# Patient Record
Sex: Female | Born: 1935 | Race: White | Hispanic: No | Smoking: Never smoker
Health system: Southern US, Community
[De-identification: ages and names within clinical notes are randomized; demographics above are authoritative.]

## PROBLEM LIST (undated history)

## (undated) DIAGNOSIS — D126 Benign neoplasm of colon, unspecified: Secondary | ICD-10-CM

## (undated) DIAGNOSIS — F419 Anxiety disorder, unspecified: Secondary | ICD-10-CM

## (undated) HISTORY — DX: Benign neoplasm of colon, unspecified: D12.6

## (undated) HISTORY — PX: BREAST BIOPSY: SHX20

## (undated) HISTORY — DX: Anxiety disorder, unspecified: F41.9

---

## 1957-11-24 HISTORY — PX: TONSILLECTOMY: SUR1361

## 1957-11-24 HISTORY — PX: APPENDECTOMY: SHX54

## 1999-06-05 ENCOUNTER — Encounter (INDEPENDENT_AMBULATORY_CARE_PROVIDER_SITE_OTHER): Payer: Self-pay | Admitting: Specialist

## 1999-06-05 ENCOUNTER — Other Ambulatory Visit: Admission: RE | Admit: 1999-06-05 | Discharge: 1999-06-05 | Payer: Self-pay | Admitting: Gastroenterology

## 1999-11-20 ENCOUNTER — Encounter: Admission: RE | Admit: 1999-11-20 | Discharge: 1999-11-20 | Payer: Self-pay | Admitting: Obstetrics and Gynecology

## 1999-11-20 ENCOUNTER — Encounter: Payer: Self-pay | Admitting: Obstetrics and Gynecology

## 1999-12-11 ENCOUNTER — Encounter: Admission: RE | Admit: 1999-12-11 | Discharge: 1999-12-11 | Payer: Self-pay | Admitting: Obstetrics and Gynecology

## 1999-12-11 ENCOUNTER — Encounter: Payer: Self-pay | Admitting: Obstetrics and Gynecology

## 2000-01-18 ENCOUNTER — Ambulatory Visit (HOSPITAL_COMMUNITY): Admission: RE | Admit: 2000-01-18 | Discharge: 2000-01-18 | Payer: Self-pay | Admitting: Internal Medicine

## 2000-01-18 ENCOUNTER — Encounter: Payer: Self-pay | Admitting: Internal Medicine

## 2001-07-07 ENCOUNTER — Encounter: Payer: Self-pay | Admitting: Obstetrics and Gynecology

## 2001-07-07 ENCOUNTER — Encounter: Admission: RE | Admit: 2001-07-07 | Discharge: 2001-07-07 | Payer: Self-pay | Admitting: Obstetrics and Gynecology

## 2002-09-21 ENCOUNTER — Encounter: Admission: RE | Admit: 2002-09-21 | Discharge: 2002-09-21 | Payer: Self-pay | Admitting: Obstetrics and Gynecology

## 2002-09-21 ENCOUNTER — Encounter: Payer: Self-pay | Admitting: Obstetrics and Gynecology

## 2004-06-13 ENCOUNTER — Observation Stay (HOSPITAL_COMMUNITY): Admission: EM | Admit: 2004-06-13 | Discharge: 2004-06-14 | Payer: Self-pay | Admitting: Emergency Medicine

## 2004-06-14 ENCOUNTER — Observation Stay (HOSPITAL_COMMUNITY): Admission: RE | Admit: 2004-06-14 | Discharge: 2004-06-15 | Payer: Self-pay | Admitting: Cardiology

## 2004-06-19 ENCOUNTER — Encounter: Admission: RE | Admit: 2004-06-19 | Discharge: 2004-06-19 | Payer: Self-pay | Admitting: Obstetrics and Gynecology

## 2004-07-10 ENCOUNTER — Encounter: Admission: RE | Admit: 2004-07-10 | Discharge: 2004-07-10 | Payer: Self-pay | Admitting: Internal Medicine

## 2004-11-21 ENCOUNTER — Ambulatory Visit: Payer: Self-pay | Admitting: Internal Medicine

## 2005-03-24 ENCOUNTER — Ambulatory Visit: Payer: Self-pay | Admitting: Internal Medicine

## 2005-04-14 ENCOUNTER — Ambulatory Visit: Payer: Self-pay | Admitting: Internal Medicine

## 2005-05-07 ENCOUNTER — Ambulatory Visit: Payer: Self-pay | Admitting: Internal Medicine

## 2005-05-21 ENCOUNTER — Ambulatory Visit: Payer: Self-pay | Admitting: Family Medicine

## 2006-09-11 ENCOUNTER — Ambulatory Visit: Payer: Self-pay | Admitting: Internal Medicine

## 2006-09-11 ENCOUNTER — Encounter: Admission: RE | Admit: 2006-09-11 | Discharge: 2006-09-11 | Payer: Self-pay | Admitting: Internal Medicine

## 2007-03-29 ENCOUNTER — Encounter: Admission: RE | Admit: 2007-03-29 | Discharge: 2007-03-29 | Payer: Self-pay | Admitting: Obstetrics and Gynecology

## 2007-06-17 ENCOUNTER — Ambulatory Visit: Payer: Self-pay | Admitting: Internal Medicine

## 2007-06-17 DIAGNOSIS — R7989 Other specified abnormal findings of blood chemistry: Secondary | ICD-10-CM | POA: Insufficient documentation

## 2007-06-17 DIAGNOSIS — I059 Rheumatic mitral valve disease, unspecified: Secondary | ICD-10-CM | POA: Insufficient documentation

## 2007-10-06 ENCOUNTER — Ambulatory Visit: Payer: Self-pay | Admitting: Internal Medicine

## 2008-05-04 ENCOUNTER — Encounter: Admission: RE | Admit: 2008-05-04 | Discharge: 2008-05-04 | Payer: Self-pay | Admitting: Obstetrics and Gynecology

## 2008-08-16 ENCOUNTER — Ambulatory Visit: Payer: Self-pay | Admitting: Internal Medicine

## 2008-09-01 ENCOUNTER — Telehealth (INDEPENDENT_AMBULATORY_CARE_PROVIDER_SITE_OTHER): Payer: Self-pay | Admitting: *Deleted

## 2008-09-04 ENCOUNTER — Ambulatory Visit: Payer: Self-pay | Admitting: Internal Medicine

## 2008-09-27 ENCOUNTER — Ambulatory Visit: Payer: Self-pay | Admitting: Gastroenterology

## 2008-10-07 ENCOUNTER — Ambulatory Visit: Payer: Self-pay | Admitting: Internal Medicine

## 2008-10-07 ENCOUNTER — Encounter: Payer: Self-pay | Admitting: Internal Medicine

## 2008-10-07 ENCOUNTER — Inpatient Hospital Stay (HOSPITAL_COMMUNITY): Admission: EM | Admit: 2008-10-07 | Discharge: 2008-10-09 | Payer: Self-pay | Admitting: *Deleted

## 2008-10-07 DIAGNOSIS — K921 Melena: Secondary | ICD-10-CM | POA: Insufficient documentation

## 2008-10-09 ENCOUNTER — Encounter: Payer: Self-pay | Admitting: Gastroenterology

## 2008-10-10 ENCOUNTER — Telehealth (INDEPENDENT_AMBULATORY_CARE_PROVIDER_SITE_OTHER): Payer: Self-pay | Admitting: *Deleted

## 2008-10-17 ENCOUNTER — Ambulatory Visit: Payer: Self-pay | Admitting: Internal Medicine

## 2008-10-17 DIAGNOSIS — E876 Hypokalemia: Secondary | ICD-10-CM | POA: Insufficient documentation

## 2008-10-24 ENCOUNTER — Encounter (INDEPENDENT_AMBULATORY_CARE_PROVIDER_SITE_OTHER): Payer: Self-pay | Admitting: *Deleted

## 2008-10-24 LAB — CONVERTED CEMR LAB
BUN: 15 mg/dL (ref 6–23)
Basophils Absolute: 0 10*3/uL (ref 0.0–0.1)
Eosinophils Relative: 2.1 % (ref 0.0–5.0)
Hemoglobin: 14.3 g/dL (ref 12.0–15.0)
Lymphocytes Relative: 39.9 % (ref 12.0–46.0)
MCHC: 34.8 g/dL (ref 30.0–36.0)
MCV: 99.7 fL (ref 78.0–100.0)
Monocytes Absolute: 0.4 10*3/uL (ref 0.1–1.0)
Neutrophils Relative %: 47.5 % (ref 43.0–77.0)
RBC: 4.12 M/uL (ref 3.87–5.11)
RDW: 12.3 % (ref 11.5–14.6)
WBC: 4 10*3/uL — ABNORMAL LOW (ref 4.5–10.5)

## 2009-04-25 ENCOUNTER — Encounter (INDEPENDENT_AMBULATORY_CARE_PROVIDER_SITE_OTHER): Payer: Self-pay | Admitting: *Deleted

## 2009-06-08 ENCOUNTER — Encounter: Admission: RE | Admit: 2009-06-08 | Discharge: 2009-06-08 | Payer: Self-pay | Admitting: Internal Medicine

## 2009-08-21 ENCOUNTER — Ambulatory Visit: Payer: Self-pay | Admitting: Internal Medicine

## 2010-01-21 ENCOUNTER — Ambulatory Visit: Payer: Self-pay | Admitting: Internal Medicine

## 2010-01-21 ENCOUNTER — Telehealth (INDEPENDENT_AMBULATORY_CARE_PROVIDER_SITE_OTHER): Payer: Self-pay | Admitting: *Deleted

## 2010-01-21 LAB — CONVERTED CEMR LAB: Inflenza A Ag: NEGATIVE

## 2010-01-22 ENCOUNTER — Telehealth: Payer: Self-pay | Admitting: Gastroenterology

## 2010-09-05 ENCOUNTER — Ambulatory Visit: Payer: Self-pay | Admitting: Internal Medicine

## 2010-12-22 LAB — CONVERTED CEMR LAB
ALT: 24 units/L (ref 0–35)
Albumin: 4.1 g/dL (ref 3.5–5.2)
Alkaline Phosphatase: 52 units/L (ref 39–117)
BUN: 14 mg/dL (ref 6–23)
Basophils Relative: 0.4 % (ref 0.0–1.0)
Bilirubin, Direct: 0.1 mg/dL (ref 0.0–0.3)
Calcium: 9.1 mg/dL (ref 8.4–10.5)
Chloride: 105 meq/L (ref 96–112)
Cholesterol: 191 mg/dL (ref 0–200)
GFR calc non Af Amer: 76 mL/min
Glucose, Bld: 92 mg/dL (ref 70–99)
HDL: 70.8 mg/dL (ref >39.0)
Hemoglobin: 14.3 g/dL (ref 12.0–15.0)
Hgb A1c MFr Bld: 5.4 % (ref 4.6–6.0)
MCHC: 35.2 g/dL (ref 30.0–36.0)
Neutro Abs: 2.9 10*3/uL (ref 1.4–7.7)
Neutrophils Relative %: 59.7 % (ref 43.0–77.0)
Platelets: 261 10*3/uL (ref 150–400)
Potassium: 3.7 meq/L (ref 3.5–5.1)
RBC: 4.19 M/uL (ref 3.87–5.11)
Total Bilirubin: 0.8 mg/dL (ref 0.3–1.2)
Total CHOL/HDL Ratio: 2.7
Triglycerides: 43 mg/dL (ref 0–149)
VLDL: 9 mg/dL (ref 0–40)

## 2010-12-24 NOTE — Progress Notes (Signed)
Summary: Schedule colonoscopy  Phone Note Outgoing Call Call back at Home Phone [phone]   Call placed by: Harlow Mares CMA Duncan Dull),  January 22, 2010 4:35 PM Call placed to: Patient Summary of Call: spoke to the patient she has a cold now she will call back to schedule her colonoscopy, i gave the patient the phone number to call back  Initial call taken by: Harlow Mares CMA Duncan Dull),  January 22, 2010 4:37 PM

## 2010-12-24 NOTE — Assessment & Plan Note (Signed)
Summary: FLU SHOT///SPH  Nurse Visit   Allergies: 1)  ! * Meclizzine 2)  Pcn 3)  Zithromax 4)  Steroids  Orders Added: 1)  Flu Vaccine 40yrs + MEDICARE PATIENTS [Q2039] 2)  Administration Flu vaccine - MCR [G0008]            Flu Vaccine Consent Questions     Do you have a history of severe allergic reactions to this vaccine? no    Any prior history of allergic reactions to egg and/or gelatin? no    Do you have a sensitivity to the preservative Thimersol? no    Do you have a past history of Guillan-Barre Syndrome? no    Do you currently have an acute febrile illness? no    Have you ever had a severe reaction to latex? no    Vaccine information given and explained to patient? yes    Are you currently pregnant? no    Lot Number:[id number]   Exp Date:05/24/2011   Site Given  Left Deltoid IMu

## 2010-12-24 NOTE — Assessment & Plan Note (Signed)
Summary: COUGH AND CONGESTION-APPT1:15/CDJ   Vital Signs:  Patient profile:   75 year old female Weight:      159 pounds O2 Sat:      97 % on Room air Temp:     99.2 degrees F oral Pulse rate:   110 / minute Resp:     14 per minute BP sitting:   118 / 80  (left arm)  Vitals Entered By: Doristine Devoid (January 21, 2010 1:24 PM)  O2 Flow:  Room air CC: cough and congestion xsat. some fever and bodyaches    Primary Care Provider:  Marga Melnick MD  CC:  cough and congestion xsat. some fever and bodyaches .  History of Present Illness: Onset as mild ST & NP cough 01/19/2010  followed by bandlike headache & malaise. ZO:XWRU Seltzer Cold & Cough. Flu shot taken in 09/2009.  Allergies: 1)  ! * Meclizzine 2)  Pcn 3)  Zithromax 4)  Steroids  Review of Systems General:  Complains of chills; denies fever and sweats. ENT:  Denies nasal congestion and sinus pressure; No facial pain or purulence. Resp:  Denies shortness of breath, sputum productive, and wheezing. Allergy:  Denies itching eyes and sneezing.  Physical Exam  General:  in no acute distress; alert,appropriate and cooperative throughout examination Ears:  External ear exam shows no significant lesions or deformities.  Otoscopic examination reveals clear canals, tympanic membranes are intact bilaterally without bulging, retraction, inflammation or discharge. Hearing is grossly normal bilaterally. Nose:  External nasal examination shows no deformity or inflammation. Nasal mucosa are pink and moist without lesions or exudates. Mouth:  Oral mucosa and oropharynx without lesions or exudates.  Teeth in good repair. Lungs:  Normal respiratory effort, chest expands symmetrically. Lungs are clear to auscultation, no crackles or wheezes. Heart:  Normal rate and regular rhythm. S1 and S2 normal without gallop, murmur, click, rub. S4 Cervical Nodes:  No lymphadenopathy noted Axillary Nodes:  No palpable  lymphadenopathy   Impression & Recommendations:  Problem # 1:  BRONCHITIS-ACUTE (ICD-466.0)  Her updated medication list for this problem includes:    Doxycycline Hyclate 100 Mg Caps (Doxycycline hyclate) .Marland Kitchen... 1 two times a day x 2 days then 1 once daily    Hydrocod Polst-chlorphen Polst 10-8 Mg/60ml Lqcr (Chlorpheniramine-hydrocodone) .Marland Kitchen... 1 tsp q 8-12 hrs as needed for cough  Orders: Prescription Created Electronically [phone]) Flu A+B ([id number])  Problem # 2:  PHARYNGITIS-ACUTE (ICD-462)  essentially resolved  Her updated medication list for this problem includes:    Doxycycline Hyclate 100 Mg Caps (Doxycycline hyclate) .Marland Kitchen... 1 two times a day x 2 days then 1 once daily  Complete Medication List: 1)  Xanax 1 Mg Tabs (Alprazolam) .... 1/4 tab qhs 2)  Doxycycline Hyclate 100 Mg Caps (Doxycycline hyclate) .Marland Kitchen.. 1 two times a day x 2 days then 1 once daily 3)  Hydrocod Polst-chlorphen Polst 10-8 Mg/30ml Lqcr (Chlorpheniramine-hydrocodone) .Marland Kitchen.. 1 tsp q 8-12 hrs as needed for cough  Patient Instructions: 1)  Drink as much fluid as you can tolerate for the next few days. Zicam as needed for sore throat. Prescriptions: HYDROCOD POLST-CHLORPHEN POLST 10-8 MG/5ML LQCR (CHLORPHENIRAMINE-HYDROCODONE) 1 tsp q 8-12 hrs as needed for cough  #60cc x 0   Entered and Authorized by:   Marga Melnick MD   Signed by:   Marga Melnick MD on 01/21/2010   Method used:   Printed then faxed to ...       [facility] [address] (retail)  [address]       [place], [place]  [address]       Ph: [phone]       Fax: [phone]   RxID:   [id number] DOXYCYCLINE HYCLATE 100 MG CAPS (DOXYCYCLINE HYCLATE) 1 two times a day X 2 days then 1 once daily  #12 x 0   Entered and Authorized by:   Marga Melnick MD   Signed by:   Marga Melnick MD on 01/21/2010   Method used:   Faxed to ...       [facility] [address] (retail)       [phone]        [place], [place]  [address]       Ph: [phone]       Fax: [phone]   RxID:   [id number]   Laboratory Results    Other Tests  Influenza A: negative Influenza B: negative  Kit Test Internal QC: Positive   (Normal Range: Negative)

## 2010-12-24 NOTE — Progress Notes (Signed)
Summary: cough  Phone Note Call from Patient Call back at Home Phone [phone]   Caller: Patient Summary of Call: patient husband stated that patient has a bad cough. Patient cough started on friday. Offered patient appt with another Dr. refused. Stated that dr hopper wanted to see her. Please advise Initial call taken by: Barb Merino,  January 21, 2010 9:06 AM  Follow-up for Phone Call        spoke w/ patient informed we can work in today w/ Dr. Alwyn Ren.Marland KitchenMarland KitchenMarland KitchenDoristine Devoid  January 21, 2010 9:37 AM

## 2011-04-08 NOTE — Discharge Summary (Signed)
Kelli Nielsen, Kelli Nielsen                  ACCOUNT NO.:  [id number]   MEDICAL RECORD NO.:  [id number]          PATIENT TYPE:  INP   LOCATION:  1401                         FACILITY:  [facility]   PHYSICIAN:  Rosalyn Gess. Norins, MD  DATE OF BIRTH:  06-04-1936   DATE OF ADMISSION:  10/07/2008  DATE OF DISCHARGE:  10/09/2008                               DISCHARGE SUMMARY   ADMITTING DIAGNOSIS:  Hematochezia.   DISCHARGE DIAGNOSIS:  Probable ischemic colitis, resolving.   CONSULTANTS:  Dr. Claudette Head for GI.   PROCEDURES:  CT scan of the abdomen which showed thickening of the  transverse colon consistent with either infectious colitis versus  ischemic colitis.   HISTORY OF PRESENT ILLNESS:  The patient was in her usual state of  health.  She had a recent exam by Dr. Kyra Manges on October 03, 2008,  that was normal although she reported the rectal exam was painful.  Over  the past 24 hours prior to admission, she developed mild right lower  quadrant abdominal pain and has had loose stools with frank blood.  She  denied fever, sweats, chills, nausea, vomiting, or mucus in the stool.  Last colonoscopy in 2007 by Dr. Arlyce Dice was unremarkable.  Patient was  admitted for IV fluids, monitoring, and IV antibiotics.   Please see the EMR H and P for past medical history, family history,  social history, and physical exam.   HOSPITAL COURSE:  Patient was admitted to a regular bed.  She was  started on ciprofloxacin and Flagyl IV.  She was seen in consultation by  the GI service.  It was felt that this was most likely an inflammatory  process versus an ischemic process.  It was felt she did not need to  have intervention or colonoscopy at this time as long as she was getting  better.   The patient did do well over the next 48 hours.  As of this morning, the  day of discharge, patient reports she has had no abdominal pain or  discomfort.  She has had no blood in her stool.  She has been up and  walking.  She has been tolerating a diet.  With the patient's symptoms  having resolved, she is now at this time felt to be ready for discharge  home.   DISCHARGE EXAM:  Temperature was 98.9.  Blood pressure 146/89.  Heart  rate 84.  Respirations 16.  O2 sat 96% on room air.  GENERAL APPEARANCE:  A well-nourished, well-groomed woman in no  distress.  CARDIOVASCULAR:  Patient had a quiet precordium with regular rate and  rhythm.  ABDOMEN:  Bowel sounds were positive in all 4 quadrants.  Her abdomen  was soft.  No guarding or rebound or tenderness was appreciated.   FINAL LABORATORY:  Final CBC, October 08, 2008, hemoglobin of 11.9 g,  white count was 9800, MCV was 100.2.  Chemistries from October 08, 2008, with a sodium of 139, potassium of 3.0, chloride of 107, CO2 of  26, BUN of 5, creatinine 0.6, glucose of 106.  Patient  did have liver  functions at the time of admission which were normal.  Urinalysis at  admission was negative.   DISPOSITION:  Patient is discharged home.  She will follow up with Dr.  Arlyce Dice for GI followup.  She will be continued on Cipro 500 mg p.o.  b.i.d. for 5 additional days and Flagyl 250 mg q.i.d. for 5 additional  days.  Patient will see Dr. Dolores Patty in followup in 7 to 10  days.   Patient's condition at time of discharge dictation is stable and  improved.      Rosalyn Gess Norins, MD  Electronically Signed     MEN/MEDQ  D:  10/09/2008  T:  10/09/2008  Job:  [id number]   cc:   Barbette Hair. Arlyce Dice, MD,FACG  [address]  [place]  [place] [address]   Titus Dubin. Alwyn Ren, MD,FACP,FCCP  [phone] [place]  [place] [address]

## 2011-04-11 NOTE — Discharge Summary (Signed)
NAMEJAKAI, ONOFRE                              ACCOUNT NO.:  [id number]   MEDICAL RECORD NO.:  [id number]                   PATIENT TYPE:  INP   LOCATION:  0354                                 FACILITY:  [facility]   PHYSICIAN:  Thomas C. Wall, M.D.                DATE OF BIRTH:  05-Oct-1936   DATE OF ADMISSION:  06/13/2004  DATE OF DISCHARGE:  06/14/2004                                 DISCHARGE SUMMARY   DISCHARGE DIAGNOSES:  1. Presyncope.     A. Etiology unclear.  2. Elevated troponin-I levels.     A. Normal coronary arteries by catheterization.  3. Normal left ventricular function.  4. Elevated liver function tests.     A. Abdominal ultrasound revealing normal liver, gallbladder, and bile        ducts.     B. Incidental finding of mild hydronephrosis of the right kidney on        abdominal ultrasound.  5. A 4-mm nonspecific right lower lobe nodule.     A. Followup per primary care physician.  6. History of mitral valve prolapse.  7. History of pancreatitis.   PROCEDURE PERFORMED THIS ADMISSION:  1. Cardiac catheterization by Dr. Samule Ohm on June 14, 2004, revealing normal     coronary arteries and an EF of 60% without regional wall motion     abnormalities.  2. Chest CT negative for pulmonary emboli, minimal basilar atelectasis, and     4-mm nonspecific right lower lobe nodule.   HOSPITAL COURSE:  Please see the admission history and physical and consult  notes for complete details. The patient was initially admitted by the  internal medicine service for presyncope. There was no frank syncope. She  was referred to the emergency room by her primary care physician. Dr. Daleen Squibb  consulted on the patient. She had mildly elevated troponins, but normal CK-  MBs. Dr. Daleen Squibb recommended patient undergo cardiac catheterization.  Of note  she had an echocardiogram that revealed normal left ventricle and no  valvular abnormalities.  The patient went for cardiac catheterization on  June 14, 2004, by Dr. Samule Ohm. As noted above, she had normal coronary  arteries and an EF of 50%. She was also sent for spiral CT of the chest,  rule out pulmonary emboli. As noted above she had no pulmonary emboli, but a  nonspecific right lower lobe nodule.   Upon admission the patient also had noted elevated LFTs with an AST of 82  and an ALT of 59. Follow-up LFTs revealed an AST of 39 and an ALT of 41.  Abdominal ultrasound was ordered by internal medicine and the results are as  noted above.  The patient will need follow-up with Dr. Alwyn Ren for this.   The patient also had orthostats performed during this admission. These were  unrevealing. She had some hypokalemia after catheterization. This was  repleted prior to  her discharge.  The patient was evaluated on June 15, 2004, and felt ready for discharge to home. So far her workup for presyncope  has been negative she will need an outpatient event recorder and follow up  with Dr. Daleen Squibb in about one month's time.  She has been asked to follow up  with Dr. Alwyn Ren in the next couple of weeks.   LABORATORY DATA:  White count 5200, hemoglobin 12.3, hematocrit 35.9,  platelet count 221,000. INR on admission was 0.9. Sodium 135, potassium 3.4,  chloride 110, CO2 21, glucose 95, BUN 9, creatinine 0.7. LFTs as noted  above.  Hemoglobin A1C 5.5.  CK-MB is negative. Troponin-I 0.46, 0.37, 0.22.  TSH 2.410. Urinalysis negative except for trace ketones.  Chest CT as noted  above. A 2-D echocardiogram as noted above.  Chest x-ray on admission  revealed generalized parabronchial thickening, but no definite acute disease  in the chest.  Abdominal ultrasound as noted above.   DISCHARGE MEDICATIONS:  The patient is to resume her prior home medications.  These include HCTZ 25 mg one-half tablet daily p.r.n. and Xanax 0.25 mg half  tablet q.h.s.   PAIN MANAGEMENT:  Tylenol as needed.   ACTIVITY:  As tolerated.   DIET:  Low fat, low sodium.   WOUND CARE:   The patient is to follow up for his groin swelling, bleeding or  bruising.   FOLLOWUP:  1. The patient will need to see Dr. Alwyn Ren in the next one to two weeks.     A. The patient will need to follow up on the mild hydronephrosis noted on        her abdominal ultrasound of the right kidney as well as follow up for        her mildly elevated LFTs and her 4 mm nonspecific right lower lobe        nodule.  2. The patient will be set up with a 30-day event recorder through our     office and be contacted.  3. The patient will see Dr. Daleen Squibb in about one month's time for followup.      Tereso Newcomer, P.A.                        Thomas C. Wall, M.D.    SW/MEDQ  D:  06/15/2004  T:  06/15/2004  Job:  [id number]   cc:   Titus Dubin. Alwyn Ren, M.D. [facility]   Maisie Fus C. Wall, M.D.

## 2011-04-11 NOTE — Cardiovascular Report (Signed)
NAMEDEMECIA, Kelli Nielsen   MEDICAL RECORD NO.:  [id number]                   PATIENT TYPE:  OBV   LOCATION:  6522                                 FACILITY:  [facility]   PHYSICIAN:  Kelli Nielsen, Kelli Nielsen         DATE OF BIRTH:  1936-04-11   DATE OF PROCEDURE:  DATE OF DISCHARGE:  06/15/2004                              CARDIAC CATHETERIZATION   PROCEDURE:  Left-heart catheterized, left ventriculography, coronary  angiography, Angio-Seal closure of the right common femoral arteriotomy  site.   INDICATION:  Kelli Nielsen is a 75 year old lady without prior history of  cardiovascular disease.  She presents with abrupt episode of unheralded  presyncope without chest pain or dyspnea.  Troponins have become mildly  elevated at 0.44.  Based on this evidence of myocardial injury, she was  referred for diagnostic catheterization.   PROCEDURAL TECHNIQUE:  Informed consent was obtained.  Under 1% lidocaine  local anesthesia, a 6-French sheath was placed in the right common femoral  artery using the modified Seldinger technique.  Diagnostic angiography and  ventriculography were performed using JL-4, JR-4 and pigtail catheters.  The  arteriotomy was then closed using a 6-French Angio-Seal device.  Complete  hemostasis was obtained.  The patient tolerated the procedure well, and was  transferred to the holding room in stable condition.   COMPLICATIONS:  None.   FINDINGS:  1. LV:  149/13/14.  EF 60% without regional wall motion abnormality.  2. No aortic stenosis or mitral regurgitation.  3. Left main:  Angiographically normal.  4. LAD:  A moderate-size vessel giving rise to two diagonal branches.  It is     angiographically normal.  5. Circumflex:  A moderate-sized vessel giving rise to one obtuse marginal.     It is angiographically normal.  6. RCA:  A large, dominant vessel.  It is angiographically normal.   IMPRESSION/PLAN:  The patient has  angiographically-normal coronary arteries  and normal left ventricular size and systolic function.  Thus, the etiology  of her near syncope and elevated troponin remains unclear.  We will check CT  angiogram to rule out PE, though I have a low clinical suspicion.                                               Kelli Nielsen, M.D. [facility]    WED/MEDQ  D:  06/14/2004  T:  06/16/2004  Job:  [id number]   cc:   Thomas C. Wall, M.D.   Titus Dubin. Alwyn Ren, M.D. [facility]

## 2011-04-17 ENCOUNTER — Other Ambulatory Visit: Payer: Self-pay | Admitting: Internal Medicine

## 2011-04-17 DIAGNOSIS — Z1231 Encounter for screening mammogram for malignant neoplasm of breast: Secondary | ICD-10-CM

## 2011-04-28 ENCOUNTER — Ambulatory Visit
Admission: RE | Admit: 2011-04-28 | Discharge: 2011-04-28 | Disposition: A | Discharge status: Home / Self Care | Payer: Medicare Other | Source: Ambulatory Visit | Attending: Internal Medicine | Admitting: Internal Medicine

## 2011-04-28 DIAGNOSIS — Z1231 Encounter for screening mammogram for malignant neoplasm of breast: Secondary | ICD-10-CM

## 2011-07-22 ENCOUNTER — Encounter: Payer: Self-pay | Admitting: Gastroenterology

## 2011-07-31 ENCOUNTER — Ambulatory Visit (AMBULATORY_SURGERY_CENTER): Payer: Medicare Other | Admitting: *Deleted

## 2011-07-31 VITALS — Ht 68.0 in | Wt 158.0 lb

## 2011-07-31 DIAGNOSIS — Z1211 Encounter for screening for malignant neoplasm of colon: Secondary | ICD-10-CM

## 2011-07-31 MED ORDER — NA SULFATE-K SULFATE-MG SULF 17.5-3.13-1.6 GM/177ML PO SOLN: 1.0000 | ORAL | Status: DC

## 2011-08-01 ENCOUNTER — Telehealth: Payer: Self-pay | Admitting: Gastroenterology

## 2011-08-01 NOTE — Telephone Encounter (Signed)
Pt had questions about taking her Align, told her to hold it Sunday, asked about taking Xanax Sunday evening, advised that she could. Wanted to know if she could wear her contacts Monday and leave her polish on. Pt does sleep in lenses advised that she could wear them for procedure and leave polish on. Wanted to know what she could do to help relieve excoriation with prep, advised vaseline and baby wipes. Asked if she could use preparation H, told her it burn and it's purpose was more for hemorrhoids but it would not hurt if she desired. TE

## 2011-08-04 ENCOUNTER — Encounter: Payer: Self-pay | Admitting: Gastroenterology

## 2011-08-04 ENCOUNTER — Ambulatory Visit (AMBULATORY_SURGERY_CENTER): Payer: Medicare Other | Admitting: Gastroenterology

## 2011-08-04 DIAGNOSIS — D126 Benign neoplasm of colon, unspecified: Secondary | ICD-10-CM

## 2011-08-04 DIAGNOSIS — D128 Benign neoplasm of rectum: Secondary | ICD-10-CM

## 2011-08-04 DIAGNOSIS — K5730 Diverticulosis of large intestine without perforation or abscess without bleeding: Secondary | ICD-10-CM

## 2011-08-04 DIAGNOSIS — Z1211 Encounter for screening for malignant neoplasm of colon: Secondary | ICD-10-CM

## 2011-08-04 DIAGNOSIS — Z86010 Personal history of colonic polyps: Secondary | ICD-10-CM

## 2011-08-04 MED ORDER — SODIUM CHLORIDE 0.9 % IV SOLN: 500.0000 mL | INTRAVENOUS | Status: DC

## 2011-08-04 NOTE — Patient Instructions (Addendum)
Diverticulosis Diverticulosis is a common condition that develops when small pouches (diverticula) form in the wall of the colon. The risk of diverticulosis increases with age. It happens more often in people who eat a low-fiber diet. Most individuals with diverticulosis have no symptoms. Those individuals with symptoms usually experience belly (abdominal) pain, constipation, or loose stools (diarrhea). HOME CARE INSTRUCTIONS Increase the amount of fiber in your diet as directed by your caregiver or dietician. This may reduce symptoms of diverticulosis.  Your caregiver may recommend taking a dietary fiber supplement.  Drink at least 6 to 8 glasses of water each day to prevent constipation.  Try not to strain when you have a bowel movement.  Your caregiver may recommend avoiding nuts and seeds to prevent complications, although this is still an uncertain benefit.  Only take over-the-counter or prescription medicines for pain, discomfort, or fever as directed by your caregiver.  FOODS HAVING HIGH FIBER CONTENT INCLUDE: Fruits. Apple, peach, pear, tangerine, raisins, prunes.  Vegetables. Brussels sprouts, asparagus, broccoli, cabbage, carrot, cauliflower, romaine lettuce, spinach, summer squash, tomato, winter squash, zucchini.  Starchy Vegetables. Baked beans, kidney beans, lima beans, split peas, lentils, potatoes (with skin).  Grains. Whole wheat bread, brown rice, bran flake cereal, plain oatmeal, white rice, shredded wheat, bran muffins.  SEEK IMMEDIATE MEDICAL CARE IF: You develop increasing pain or severe bloating.  You have an oral temperature above 100, not controlled by medicine.  You develop vomiting or bowel movements that are bloody or black.  Document Released: 08/07/2004 Document Re-Released: 04/30/2010 [facility] Patient Information 2011 [place], [place]  .Polyps, Colon  A polyp is extra tissue that grows inside your body. Colon polyps grow in the large intestine. The large intestine,  also called the colon, is part of your digestive system. It is a long, hollow tube at the end of your digestive tract where your body makes and stores stool. Most polyps are not dangerous. They are benign. This means they are not cancerous. But over time, some types of polyps can turn into cancer. Polyps that are smaller than a pea are usually not harmful. But larger polyps could someday become or may already be cancerous. To be safe, doctors remove all polyps and test them.  WHO GETS POLYPS? Anyone can get polyps, but certain people are more likely than others. You may have a greater chance of getting polyps if:  You are over 50.   You have had polyps before.   Someone in your family has had polyps.   Someone in your family has had cancer of the large intestine.   Find out if someone in your family has had polyps. You may also be more likely to get polyps if you:   Eat a lot of fatty foods   Smoke   Drink alcohol   Do not exercise  Eat too much  SYMPTOMS Most small polyps do not cause symptoms. People often do not know they have one until their caregiver finds it during a regular checkup or while testing them for something else. Some people do have symptoms like these:  Bleeding from the anus. You might notice blood on your underwear or on toilet paper after you have had a bowel movement.   Constipation or diarrhea that lasts more than a week.   Blood in the stool. Blood can make stool look black or it can show up as red streaks in the stool.  If you have any of these symptoms, see your caregiver. HOW DOES THE DOCTOR  TEST FOR POLYPS? The doctor can use four tests to check for polyps:  Digital rectal exam. The caregiver wears gloves and checks your rectum (the last part of the large intestine) to see if it feels normal. This test would find polyps only in the rectum. Your caregiver may need to do one of the other tests listed below to find polyps higher up in the intestine.    Barium enema. The caregiver puts a liquid called barium into your rectum before taking x-rays of your large intestine. Barium makes your intestine look white in the pictures. Polyps are dark, so they are easy to see.   Sigmoidoscopy. With this test, the caregiver can see inside your large intestine. A thin flexible tube is placed into your rectum. The device is called a sigmoidoscope, which has a light and a tiny video camera in it. The caregiver uses the sigmoidoscope to look at the last third of your large intestine.   Colonoscopy. This test is like sigmoidoscopy, but the caregiver looks at all of the large intestine. It usually requires sedation. This is the most common method for finding and removing polyps.  TREATMENT  The caregiver will remove the polyp during sigmoidoscopy or colonoscopy. The polyp is then tested for cancer.   If you have had polyps, your caregiver may want you to get tested regularly in the future.  PREVENTION There is not one sure way to prevent polyps. You might be able to lower your risk of getting them if you:  Eat more fruits and vegetables and less fatty food.   Do not smoke.   Avoid alcohol.   Exercise every day.   Lose weight if you are overweight.   Eating more calcium and folate can also lower your risk of getting polyps. Some foods that are rich in calcium are milk, cheese, and broccoli. Some foods that are rich in folate are chickpeas, kidney beans, and spinach.   Aspirin might help prevent polyps. Studies are under way.  Document Released: 08/06/2004 Document Re-Released: 04/30/2010 [facility] Patient Information 2011 [place], [place].   See the picture page for your findings from your exam today.  Follow the green and blue discharge instruction sheets the rest of the day.  Resume your prior medications today. Please call if any questions or concerns.

## 2011-08-04 NOTE — Progress Notes (Signed)
No complaints noted in the recovery room. MAW 

## 2011-08-05 ENCOUNTER — Telehealth: Payer: Self-pay | Admitting: *Deleted

## 2011-08-05 NOTE — Telephone Encounter (Signed)
No answer, no i.d., no message left

## 2011-08-15 ENCOUNTER — Ambulatory Visit (INDEPENDENT_AMBULATORY_CARE_PROVIDER_SITE_OTHER): Payer: Medicare Other | Admitting: Family Medicine

## 2011-08-15 ENCOUNTER — Encounter: Payer: Self-pay | Admitting: Family Medicine

## 2011-08-15 VITALS — BP 128/80 | HR 83 | Temp 98.6°F | Wt 152.2 lb

## 2011-08-15 DIAGNOSIS — J069 Acute upper respiratory infection, unspecified: Secondary | ICD-10-CM

## 2011-08-15 MED ORDER — GUAIFENESIN-CODEINE 100-10 MG/5ML PO SYRP: ORAL_SOLUTION | ORAL | Status: DC

## 2011-08-15 MED ORDER — AZELASTINE HCL 0.15 % NA SOLN: NASAL | Status: DC

## 2011-08-15 NOTE — Progress Notes (Signed)
  Subjective:     Kelli Nielsen is a 75 y.o. female who presents for evaluation of symptoms of a URI. Symptoms include coryza, cough described as nonproductive, low grade fever, post nasal drip, sneezing and scratchy throat. Onset of symptoms was 2 days ago, and has been unchanged since that time. Treatment to date: coricidan.  The following portions of the patient's history were reviewed and updated as appropriate: allergies, current medications, past family history, past medical history, past social history, past surgical history and problem list.  Review of Systems Pertinent items are noted in HPI.   Objective:    BP 128/80  Pulse 83  Temp(Src) 98.6 F (37 C) (Oral)  Wt 152 lb 3.2 oz (69.037 kg)  SpO2 97% General appearance: alert, cooperative and appears stated age Head: Normocephalic, without obvious abnormality, atraumatic Eyes: conjunctivae/corneas clear. PERRL, EOM's intact. Fundi benign. Ears: normal TM's and external ear canals both ears Nose: clear discharge, mild congestion Throat: lips, mucosa, and tongue normal; teeth and gums normal Neck: no adenopathy, no carotid bruit, no JVD, supple, symmetrical, trachea midline and thyroid not enlarged, symmetric, no tenderness/mass/nodules Lungs: clear to auscultation bilaterally Heart: S1, S2 normal Extremities: extremities normal, atraumatic, no cyanosis or edema   Assessment:    viral upper respiratory illness   Plan:    Discussed diagnosis and treatment of URI. Suggested symptomatic OTC remedies. Nasal saline spray for congestion. Follow up as needed.

## 2011-08-15 NOTE — Patient Instructions (Signed)
Common Cold, Adult An upper respiratory tract infection, or cold, is a viral infection of the air passages to the lung. Colds are contagious, especially during the first 3 or 4 days. Antibiotics cannot cure a cold. Cold germs are spread by coughs, sneezes, and hand to hand contact. A respiratory tract infection usually clears up in a few days, but some people may be sick for a week or two. HOME CARE INSTRUCTIONS  Only take over-the-counter or prescription medicines for pain, discomfort, or fever as directed by your caregiver.   Be careful not to blow your nose too hard. This may cause a nosebleed.   Use a cool-mist humidifier (vaporizer) to increase air moisture. This will make it easier for you to breath. Do not use hot steam.   Rest as much as possible and get plenty of sleep.   Wash your hands often, especially after you blow your nose. Cover your mouth and nose with a tissue when you sneeze or cough.   Drink at least 8 glasses of clear liquids every day, such as water, fruit juices, tea, clear soups, and carbonated beverages.  SEEK MEDICAL CARE IF:  An oral temperature above 100.4 lasts 4 days or more, and is not controlled by medication.   You have a sore throat that gets worse or you see white or yellow spots in your throat.   Your cough gets worse or lasts more than 10 days.   You have a rash somewhere on your skin. You have large and tender lumps in your neck.   You have an earache or a headache.   You have thick, greenish or yellowish discharge from your nose.   You cough-up thick yellow, green, gray or bloody mucus (secretions).  SEEK IMMEDIATE MEDICAL CARE IF: You have trouble breathing, chest pain, or your skin or nails look gray or blue. MAKE SURE YOU:   Understand these instructions.   Will watch your condition.   Will get help right away if you are not doing well or get worse.  Document Released: 11/07/2000 Document Re-Released: 10/23/2008 [employer] Patient  Information 2011 [employer]. 

## 2011-08-26 LAB — COMPREHENSIVE METABOLIC PANEL
ALT: 22
AST: 27
Albumin: 3.9
Calcium: 9
GFR calc Af Amer: >60
Sodium: 134 — ABNORMAL LOW
Total Protein: 6.6

## 2011-08-26 LAB — CBC
HCT: 35 — ABNORMAL LOW
Hemoglobin: 11.9 — ABNORMAL LOW
MCHC: 34
Platelets: 255
RBC: 4.07
RDW: 12.8
RDW: 13.1
WBC: 9.8

## 2011-08-26 LAB — BASIC METABOLIC PANEL
GFR calc non Af Amer: >60
Glucose, Bld: 106 — ABNORMAL HIGH
Potassium: 3 — ABNORMAL LOW
Sodium: 139

## 2011-08-26 LAB — DIFFERENTIAL
Eosinophils Absolute: 0
Eosinophils Relative: 0
Lymphs Abs: 1
Monocytes Relative: 4

## 2011-08-26 LAB — URINALYSIS, ROUTINE W REFLEX MICROSCOPIC
Bilirubin Urine: NEGATIVE
Nitrite: NEGATIVE
Specific Gravity, Urine: 1.019
pH: 7

## 2011-09-02 ENCOUNTER — Ambulatory Visit: Payer: Medicare Other

## 2011-09-02 ENCOUNTER — Ambulatory Visit (INDEPENDENT_AMBULATORY_CARE_PROVIDER_SITE_OTHER): Payer: Medicare Other

## 2011-09-02 DIAGNOSIS — Z23 Encounter for immunization: Secondary | ICD-10-CM

## 2011-11-03 ENCOUNTER — Ambulatory Visit (INDEPENDENT_AMBULATORY_CARE_PROVIDER_SITE_OTHER): Payer: Medicare Other | Admitting: Internal Medicine

## 2011-11-03 ENCOUNTER — Encounter: Payer: Self-pay | Admitting: Internal Medicine

## 2011-11-03 VITALS — BP 122/80 | HR 89 | Temp 98.0°F | Wt 155.6 lb

## 2011-11-03 DIAGNOSIS — R05 Cough: Secondary | ICD-10-CM

## 2011-11-03 DIAGNOSIS — R058 Other specified cough: Secondary | ICD-10-CM

## 2011-11-03 DIAGNOSIS — R059 Cough, unspecified: Secondary | ICD-10-CM

## 2011-11-03 MED ORDER — BENZONATATE 100 MG PO CAPS: 100.0000 mg | ORAL_CAPSULE | Freq: Three times a day (TID) | ORAL | Status: AC | PRN

## 2011-11-03 MED ORDER — MONTELUKAST SODIUM 10 MG PO TABS: 10.0000 mg | ORAL_TABLET | Freq: Every day | ORAL | Status: DC

## 2011-11-03 NOTE — Progress Notes (Signed)
  Subjective:    Patient ID: Kelli Nielsen, female    DOB: Jul 02, 1936, 75 y.o.   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 Nile Riggs for OS  infection    Objective:   Physical Exam General appearance : thin but  In good health and nourishment; no acute distress or increased work of breathing is present.  No  lymphadenopathy about the head, neck, or axilla noted.   Eyes: No conjunctival inflammation or lid edema is present.   Ears:  External ear exam shows no significant lesions or deformities.  Otoscopic examination reveals clear canals, tympanic membranes are intact bilaterally without bulging, retraction, inflammation or discharge.  Nose:  External nasal examination shows no deformity or inflammation. Nasal mucosa are pink and moist without lesions or exudates. No septal dislocation .No obstruction to airflow.   Oral exam: Dental hygiene is good; lips and gums are healthy appearing.There is no oropharyngeal erythema or exudate noted.    Heart:  Normal rate and regular rhythm. S1 and S2 normal without gallop, murmur, click, rub or other extra sounds.   Lungs:Chest clear to auscultation; no wheezes, rhonchi,rales ,or rubs present.No increased work of breathing.    Extremities:  No cyanosis, edema, or clubbing  noted    Skin: Warm & dry           Assessment & Plan:  #1 cough with extrinsic symptoms. No improvement with Zyrtec. The history  suggests possible allergy; the seasonality suggest it could be related to exposure. She has worked in the [employer] for years without recurrent or persistent symptoms. Plan: see orders and recommendations.

## 2011-11-21 ENCOUNTER — Ambulatory Visit (INDEPENDENT_AMBULATORY_CARE_PROVIDER_SITE_OTHER): Payer: Medicare Other

## 2011-11-21 DIAGNOSIS — Z23 Encounter for immunization: Secondary | ICD-10-CM

## 2012-04-20 ENCOUNTER — Telehealth: Payer: Self-pay | Admitting: Internal Medicine

## 2012-04-20 NOTE — Telephone Encounter (Signed)
Caller: [place]/Patient; PCP: Marga Melnick; CB#: [phone]; ; ; Call regarding R. Sided Abdominal Pain; Onset 04/19/12; pain is located under the rib on the R side, radiating into the back.  Denies emergent symptoms per protocol; appt sched for See Within 72 hours 04/21/12 0800 with Dr. Beverely Low.

## 2012-04-21 ENCOUNTER — Encounter: Payer: Self-pay | Admitting: Family Medicine

## 2012-04-21 ENCOUNTER — Ambulatory Visit (INDEPENDENT_AMBULATORY_CARE_PROVIDER_SITE_OTHER): Payer: Medicare Other | Admitting: Family Medicine

## 2012-04-21 VITALS — BP 119/78 | HR 82 | Temp 98.1°F | Ht 67.0 in | Wt 153.4 lb

## 2012-04-21 DIAGNOSIS — R1011 Right upper quadrant pain: Secondary | ICD-10-CM | POA: Insufficient documentation

## 2012-04-21 LAB — CBC WITH DIFFERENTIAL/PLATELET
Basophils Absolute: 0 10*3/uL (ref 0.0–0.1)
Eosinophils Absolute: 0.1 10*3/uL (ref 0.0–0.7)
HCT: 44.5 % (ref 36.0–46.0)
Hemoglobin: 14.6 g/dL (ref 12.0–15.0)
Lymphs Abs: 1.3 10*3/uL (ref 0.7–4.0)
MCHC: 32.8 g/dL (ref 30.0–36.0)
MCV: 101 fl — ABNORMAL HIGH (ref 78.0–100.0)
Monocytes Absolute: 0.4 10*3/uL (ref 0.1–1.0)
Neutro Abs: 3.7 10*3/uL (ref 1.4–7.7)
RDW: 13.3 % (ref 11.5–14.6)

## 2012-04-21 LAB — BASIC METABOLIC PANEL
Calcium: 9.7 mg/dL (ref 8.4–10.5)
Creatinine, Ser: 0.6 mg/dL (ref 0.4–1.2)

## 2012-04-21 LAB — LIPASE: Lipase: 38 U/L (ref 11.0–59.0)

## 2012-04-21 LAB — AMYLASE: Amylase: 64 U/L (ref 27–131)

## 2012-04-21 LAB — HEPATIC FUNCTION PANEL
Bilirubin, Direct: 0 mg/dL (ref 0.0–0.3)
Total Bilirubin: 1.1 mg/dL (ref 0.3–1.2)

## 2012-04-21 NOTE — Patient Instructions (Signed)
We'll notify you of your lab results If the pain returns, please call and we'll proceed w/ ultrasound Drink plenty of fluids Call with any questions or concerns Hang in there!!

## 2012-04-21 NOTE — Assessment & Plan Note (Signed)
New to provider.  Pt reports hx of similar but sxs were less severe and shorter.  Pt asymptomatic today.  Differential includes cholecystitis, pancreatitis, gas/bloating, infxn, GERD.  PE WNL.  Check labs.  Discussed [place] but pt prefers to do labs first.  Pt to call if sxs return and at this time we will proceed w/ [place].  Reviewed supportive care and red flags that should prompt return.  Pt expressed understanding and is in agreement w/ plan.

## 2012-04-21 NOTE — Progress Notes (Signed)
  Subjective:    Patient ID: Kelli Nielsen, female    DOB: 03/25/1936, 76 y.o.   MRN: [id number]  HPI RUQ pain- sxs started 'a couple of days ago'.  Described as a 'numb pain'.  Intermittent.  Would last for 3-4 hrs at a time.  Improved w/ lying down.  'possibly' worse w/ eating.  No nausea, vomiting, diarrhea.  Increased gas.  Increased intake w/ the holiday weekend.  No fevers.  Feeling well today.  'at its worst it radiated to my back'.  Pt w/ hx of similar that resolved on its own.   Review of Systems For ROS see HPI     Objective:   Physical Exam  Vitals reviewed. Constitutional: She is oriented to person, place, and time. She appears well-developed and well-nourished. No distress.  Neck: Normal range of motion. Neck supple.  Cardiovascular: Normal rate, regular rhythm, normal heart sounds and intact distal pulses.   No murmur heard. Pulmonary/Chest: Effort normal and breath sounds normal. No respiratory distress. She has no wheezes. She has no rales.  Abdominal: Soft. Bowel sounds are normal. She exhibits no distension. There is no tenderness. There is no rebound and no guarding.  Lymphadenopathy:    She has no cervical adenopathy.  Neurological: She is alert and oriented to person, place, and time.  Skin: Skin is warm and dry. No rash noted.  Psychiatric: She has a normal mood and affect. Her behavior is normal. Thought content normal.          Assessment & Plan:

## 2012-04-23 ENCOUNTER — Other Ambulatory Visit (INDEPENDENT_AMBULATORY_CARE_PROVIDER_SITE_OTHER): Payer: Medicare Other

## 2012-04-23 DIAGNOSIS — E875 Hyperkalemia: Secondary | ICD-10-CM

## 2012-04-23 LAB — BASIC METABOLIC PANEL
CO2: 26 mEq/L (ref 19–32)
Calcium: 9.4 mg/dL (ref 8.4–10.5)
Chloride: 107 mEq/L (ref 96–112)
Sodium: 140 mEq/L (ref 135–145)

## 2012-05-20 ENCOUNTER — Ambulatory Visit (INDEPENDENT_AMBULATORY_CARE_PROVIDER_SITE_OTHER): Payer: Medicare Other | Admitting: Family Medicine

## 2012-05-20 ENCOUNTER — Telehealth: Payer: Self-pay | Admitting: Internal Medicine

## 2012-05-20 ENCOUNTER — Encounter: Payer: Self-pay | Admitting: Family Medicine

## 2012-05-20 VITALS — BP 130/75 | HR 79 | Temp 98.4°F | Ht 67.0 in | Wt 155.6 lb

## 2012-05-20 DIAGNOSIS — R399 Unspecified symptoms and signs involving the genitourinary system: Secondary | ICD-10-CM | POA: Insufficient documentation

## 2012-05-20 DIAGNOSIS — R3989 Other symptoms and signs involving the genitourinary system: Secondary | ICD-10-CM

## 2012-05-20 MED ORDER — PHENAZOPYRIDINE HCL 95 MG PO TABS: 95.0000 mg | ORAL_TABLET | Freq: Three times a day (TID) | ORAL | Status: AC | PRN

## 2012-05-20 MED ORDER — SULFAMETHOXAZOLE-TRIMETHOPRIM 800-160 MG PO TABS: 1.0000 | ORAL_TABLET | Freq: Two times a day (BID) | ORAL | Status: AC

## 2012-05-20 NOTE — Progress Notes (Signed)
  Subjective:    Patient ID: Kelli Nielsen, female    DOB: 15-Sep-1936, 76 y.o.   MRN: [id number]  HPI Urinary frequency and burning- hx of frequent infxns, drinks cranberry juice and lots of water.  sxs started last night.  No hematuria.  No fevers, back pain.   Review of Systems For ROS see HPI     Objective:   Physical Exam  Vitals reviewed. Constitutional: She appears well-developed and well-nourished. No distress.  Abdominal: Soft. She exhibits no distension. There is no tenderness (no suprapubic or CVA tenderness).          Assessment & Plan:

## 2012-05-20 NOTE — Telephone Encounter (Signed)
Patient is calling today with onset of UTI s/sx last evening 05/19/12.  + burning and frequency. No abd or back pain, no blood or fever noted. Requesting something for UTI symptoms. Last UOP 08:00a.m.  Emergent s/sx ruled out per Urinary Symptoms protocol for females with exception of "One or more UTI symptoms and has not been evaluated". See in 24 hours. Appt scheduled today 05/20/12 with Dr. Lavonna Rua at 09:30 at [facility]. Patient expressed understanding of time, date, location. Home care reviewed .

## 2012-05-20 NOTE — Assessment & Plan Note (Signed)
New to provider.  Pt reports hx of frequent UTIs- typically calls GYN for meds but he has retired.  Will start Bactrim and pyridium and await urine cx.  Pt expressed understanding and is in agreement w/ plan.

## 2012-05-20 NOTE — Patient Instructions (Addendum)
Drink plenty of fluids We'll call you if we need to change the antibiotics Use the pyridium as needed for pain Call with any questions or concerns Hang in there!!!

## 2012-05-21 LAB — URINE CULTURE
Colony Count: NO GROWTH
Organism ID, Bacteria: NO GROWTH

## 2012-05-28 ENCOUNTER — Telehealth: Payer: Self-pay | Admitting: Family Medicine

## 2012-05-28 NOTE — Telephone Encounter (Signed)
Pt states she has received a call twice and needs to know what it is concerning. Please call [phone] if before 2pm and ask them to page her. After 2pm can call home # and leave a detailed message if she does not answer.

## 2012-05-28 NOTE — Telephone Encounter (Signed)
LMOVM for pt to return call 

## 2012-05-28 NOTE — Telephone Encounter (Signed)
If she is still having sxs she will need f/u here or at urology

## 2012-05-28 NOTE — Telephone Encounter (Signed)
PT states that she is much better but still feels a little something there.   No UTI. Please call and see if sxs have improved

## 2012-06-01 NOTE — Telephone Encounter (Signed)
Called pt to offer follow up OV per pt notes she does not have a urologist, pt stated that she is feeling better today and will just continue to drink more water, per feels this is still just a UTI per her hx, notes she feels that part of her problems is she is unable to go to the bathroom as much as she needs to, pt declined apt and will call back if she decides she needs one, MD Tabori made aware verbally

## 2012-06-01 NOTE — Telephone Encounter (Signed)
Called pt home and was advised to call pt work, called pt at work and was advised to call her back per with a customer, advised pt that I will try to call her back asap in between pt's, pt understood

## 2012-09-08 ENCOUNTER — Ambulatory Visit (INDEPENDENT_AMBULATORY_CARE_PROVIDER_SITE_OTHER): Payer: Medicare Other | Admitting: *Deleted

## 2012-09-08 DIAGNOSIS — Z23 Encounter for immunization: Secondary | ICD-10-CM

## 2012-11-24 HISTORY — PX: COLONOSCOPY: SHX174

## 2013-03-21 ENCOUNTER — Telehealth: Payer: Self-pay | Admitting: Internal Medicine

## 2013-03-21 NOTE — Telephone Encounter (Signed)
Left message on voicemail informing patient if vaccine received at age 75 or older another vaccine is NOT required

## 2013-03-21 NOTE — Telephone Encounter (Signed)
Patient states that her last pneumonia shot was 09/04/2008 and wants to know when she should get another one?

## 2013-05-10 ENCOUNTER — Emergency Department (HOSPITAL_COMMUNITY): Payer: Medicare Other

## 2013-05-10 ENCOUNTER — Encounter (HOSPITAL_COMMUNITY): Payer: Self-pay | Admitting: *Deleted

## 2013-05-10 ENCOUNTER — Emergency Department (HOSPITAL_COMMUNITY)
Admission: EM | Admit: 2013-05-10 | Discharge: 2013-05-10 | Disposition: A | Discharge status: Home / Self Care | Payer: Medicare Other | Attending: Emergency Medicine | Admitting: Emergency Medicine

## 2013-05-10 DIAGNOSIS — R1011 Right upper quadrant pain: Secondary | ICD-10-CM | POA: Insufficient documentation

## 2013-05-10 DIAGNOSIS — Z880 Allergy status to penicillin: Secondary | ICD-10-CM | POA: Insufficient documentation

## 2013-05-10 DIAGNOSIS — Z860100 Personal history of colon polyps, unspecified: Secondary | ICD-10-CM | POA: Insufficient documentation

## 2013-05-10 DIAGNOSIS — R109 Unspecified abdominal pain: Secondary | ICD-10-CM

## 2013-05-10 DIAGNOSIS — Z79899 Other long term (current) drug therapy: Secondary | ICD-10-CM | POA: Insufficient documentation

## 2013-05-10 DIAGNOSIS — Z9089 Acquired absence of other organs: Secondary | ICD-10-CM | POA: Insufficient documentation

## 2013-05-10 LAB — COMPREHENSIVE METABOLIC PANEL
ALT: 16 U/L (ref 0–35)
AST: 21 U/L (ref 0–37)
Albumin: 3.9 g/dL (ref 3.5–5.2)
Alkaline Phosphatase: 52 U/L (ref 39–117)
Chloride: 104 mEq/L (ref 96–112)
Potassium: 3.7 mEq/L (ref 3.5–5.1)
Sodium: 139 mEq/L (ref 135–145)
Total Bilirubin: 0.6 mg/dL (ref 0.3–1.2)
Total Protein: 7.1 g/dL (ref 6.0–8.3)

## 2013-05-10 LAB — CBC WITH DIFFERENTIAL/PLATELET
Basophils Absolute: 0 10*3/uL (ref 0.0–0.1)
Basophils Relative: 1 % (ref 0–1)
Eosinophils Absolute: 0.1 10*3/uL (ref 0.0–0.7)
Hemoglobin: 13.9 g/dL (ref 12.0–15.0)
MCH: 31.9 pg (ref 26.0–34.0)
MCHC: 33 g/dL (ref 30.0–36.0)
Monocytes Relative: 10 % (ref 3–12)
Neutro Abs: 2.5 10*3/uL (ref 1.7–7.7)
Neutrophils Relative %: 56 % (ref 43–77)
Platelets: 235 10*3/uL (ref 150–400)
RDW: 13.3 % (ref 11.5–15.5)

## 2013-05-10 LAB — URINALYSIS, ROUTINE W REFLEX MICROSCOPIC
Ketones, ur: NEGATIVE mg/dL
Nitrite: NEGATIVE
Specific Gravity, Urine: 1.015 (ref 1.005–1.030)
Urobilinogen, UA: 0.2 mg/dL (ref 0.0–1.0)
pH: 7 (ref 5.0–8.0)

## 2013-05-10 LAB — LIPASE, BLOOD: Lipase: 48 U/L (ref 11–59)

## 2013-05-10 LAB — URINE MICROSCOPIC-ADD ON

## 2013-05-10 MED ORDER — DICYCLOMINE HCL 20 MG PO TABS: 20.0000 mg | ORAL_TABLET | Freq: Two times a day (BID) | ORAL | Status: DC

## 2013-05-10 NOTE — ED Provider Notes (Signed)
History     CSN: [id number]  Arrival date & time 05/10/13  0506   First MD Initiated Contact with Patient 05/10/13 [phone]      Chief Complaint  Patient presents with  . Abdominal Pain    (Consider location/radiation/quality/duration/timing/severity/associated sxs/prior treatment) HPI Comments: Patient presents to the ER for evaluation of abdominal pain. Patient reports that she had onset of right upper quadrant abdominal pain yesterday it has been present through the night. The pain radiates into her back on the right side. She has not had any fever, nausea, vomiting. She had one episode of diarrhea this morning. No constipation. Patient denies urinary symptoms. She has not had any chest pain or shortness of breath.  Patient is a 77 y.o. female presenting with abdominal pain.  Abdominal Pain Associated symptoms include abdominal pain. Pertinent negatives include no chest pain and no shortness of breath.    Past Medical History  Diagnosis Date  . Adenomatous colon polyp     Past Surgical History  Procedure Laterality Date  . Appendectomy  1959    peritonitis  . Tonsillectomy  1959  . Breast biopsy      benign  . Colonoscopy      No family history on file.  History  Substance Use Topics  . Smoking status: Never Smoker   . Smokeless tobacco: Never Used  . Alcohol Use: 1.2 oz/week    2 Glasses of wine per week    OB History   Grav Para Term Preterm Abortions TAB SAB Ect Mult Living                  Review of Systems  Constitutional: Negative for fever.  Respiratory: Negative for shortness of breath.   Cardiovascular: Negative for chest pain.  Gastrointestinal: Positive for abdominal pain.  All other systems reviewed and are negative.    Allergies  Azithromycin and Penicillins  Home Medications   Current Outpatient Rx  Name  Route  Sig  Dispense  Refill  . Probiotic Product (ALIGN) 4 MG CAPS   Oral   Take 1 capsule by mouth daily.             BP  168/73  Pulse 88  Temp(Src) 98.2 F (36.8 C)  Resp 20  SpO2 98%  Physical Exam  Constitutional: She is oriented to person, place, and time. She appears well-developed and well-nourished. No distress.  HENT:  Head: Normocephalic and atraumatic.  Right Ear: Hearing normal.  Left Ear: Hearing normal.  Nose: Nose normal.  Mouth/Throat: Oropharynx is clear and moist and mucous membranes are normal.  Eyes: Conjunctivae and EOM are normal. Pupils are equal, round, and reactive to light.  Neck: Normal range of motion. Neck supple.  Cardiovascular: Regular rhythm, S1 normal and S2 normal.  Exam reveals no gallop and no friction rub.   No murmur heard. Pulmonary/Chest: Effort normal and breath sounds normal. No respiratory distress. She exhibits no tenderness.  Abdominal: Soft. Normal appearance and bowel sounds are normal. There is no hepatosplenomegaly. There is tenderness in the right upper quadrant. There is no rebound, no guarding, no tenderness at McBurney's point and negative Murphy's sign. No hernia.  Musculoskeletal: Normal range of motion.  Neurological: She is alert and oriented to person, place, and time. She has normal strength. No cranial nerve deficit or sensory deficit. Coordination normal. GCS eye subscore is 4. GCS verbal subscore is 5. GCS motor subscore is 6.  Skin: Skin is warm, dry and intact. No  rash noted. No cyanosis.  Psychiatric: She has a normal mood and affect. Her speech is normal and behavior is normal. Thought content normal.    ED Course  Procedures (including critical care time)  Labs Reviewed  URINALYSIS, ROUTINE W REFLEX MICROSCOPIC - Abnormal; Notable for the following:    Leukocytes, UA TRACE (*)    All other components within normal limits  COMPREHENSIVE METABOLIC PANEL - Abnormal; Notable for the following:    Glucose, Bld 113 (*)    GFR calc non Af Amer 89 (*)    All other components within normal limits  URINE MICROSCOPIC-ADD ON - Abnormal;  Notable for the following:    Bacteria, UA FEW (*)    All other components within normal limits  CBC WITH DIFFERENTIAL  LIPASE, BLOOD   Dg Chest 2 View  05/10/2013   *RADIOLOGY REPORT*  Clinical Data: Right upper quadrant pain.  Nonsmoker.  CHEST - 2 VIEW  Comparison: 06/13/2004 and 11/20/1999.  Findings: Biapical pleural thickening without associated bony destruction.  Increased lung markings appear chronic without segmental infiltrate, congestive heart failure or pneumothorax.  Slightly tortuous aorta.  Heart size within normal limits.  No osseous abnormality.  IMPRESSION: No acute abnormality.  Please see above.   Original Report Authenticated By: Lacy Duverney, M.D.   US Abdomen Complete  05/10/2013   *RADIOLOGY REPORT*  Clinical Data:  Right upper quadrant abdominal pain.  COMPLETE ABDOMINAL ULTRASOUND  Comparison:  CT scan 10/07/2008.  Findings:  Gallbladder:  No gallstones, gallbladder wall thickening, or pericholecystic fluid.  Common bile duct:  Normal in caliber measuring a maximum of 5.42mm.  Liver:  The liver is sonographically unremarkable.  There is normal echogenicity without focal lesions or intrahepatic biliary dilatation.  IVC:  Normal caliber.  Pancreas:  Sonographically unremarkable.  Spleen:  Normal size and echogenicity without focal lesions.  Right Kidney:  10.2 cm in length. Normal renal cortical thickness and echogenicity without focal lesions or hydronephrosis.  Left Kidney:  11.6 cm in length. Normal renal cortical thickness and echogenicity without focal lesions or hydronephrosis.  Abdominal aorta:  Normal caliber.  IMPRESSION: Unremarkable abdominal ultrasound examination.   Original Report Authenticated By: Rudie Meyer, M.D.     Diagnosis: Abdominal pain    MDM  Patient presented to the ER for evaluation of right upper abdominal pain radiating into her back. She did not have a fever and there was no nausea, vomiting. She had one episode of diarrhea earlier today.  Abdominal exam is benign, had slight tenderness in her upper quadrant but no guarding, rebound, peritonitis. Negative Murphy's. Lab work was entirely normal. Patient sent for ultrasound to rule out gallbladder disease and was normal. Patient is sent for x-ray to rule out right lower lung pathology such as pneumonia and this was normal. Patient had another episode of diarrhea here in the ER and symptoms are improving. Pain likely secondary to decreased motility of diarrhea, we'll treat symptomatically. Patient is to follow up with her doctor in the office in one to 2 days if not improving. Return to the ER if symptoms worsen.        Gilda Crease, MD 05/10/13 [phone]

## 2013-05-10 NOTE — ED Notes (Signed)
Pt c/o right upper quad pain all night; radiating thru to back; denies n/v

## 2013-05-19 ENCOUNTER — Telehealth: Payer: Self-pay | Admitting: Internal Medicine

## 2013-05-19 ENCOUNTER — Ambulatory Visit (INDEPENDENT_AMBULATORY_CARE_PROVIDER_SITE_OTHER): Payer: Medicare Other | Admitting: Family Medicine

## 2013-05-19 ENCOUNTER — Encounter: Payer: Self-pay | Admitting: Family Medicine

## 2013-05-19 VITALS — BP 160/90 | HR 84 | Temp 98.1°F | Wt 148.6 lb

## 2013-05-19 DIAGNOSIS — N760 Acute vaginitis: Secondary | ICD-10-CM

## 2013-05-19 MED ORDER — FLUCONAZOLE 150 MG PO TABS: 150.0000 mg | ORAL_TABLET | Freq: Once | ORAL | Status: DC

## 2013-05-19 NOTE — Telephone Encounter (Signed)
Patient Information:  Caller Name: [place]  Phone: [phone]  Patient: Kelli Nielsen, Kelli Nielsen  Gender: Female  DOB: [birth year]  Age: 77 Years  PCP: Marga Melnick  Office Follow Up:  Does the office need to follow up with this patient?: No  Instructions For The Office: N/A  RN Note:  Patient requested appointment with Dr. Beverely Low instead of Dr. Alwyn Ren due to nature of this visit. Advised patient to call back if symptoms worsen before appointment time.  Symptoms  Reason For Call & Symptoms: Yeast infection symptoms - reports itching/burning to the vaginal area  Reviewed Health History In EMR: Yes  Reviewed Medications In EMR: Yes  Reviewed Allergies In EMR: Yes  Reviewed Surgeries / Procedures: Yes  Date of Onset of Symptoms: 05/18/2013  Guideline(s) Used:  Vulvar Symptoms  Disposition Per Guideline:   See Today or Tomorrow in Office  Reason For Disposition Reached:   Moderate-Severe itching (i.e., interferes with school, work, or sleep)  Advice Given:  N/A  Patient Will Follow Care Advice:  YES  Appointment Scheduled:  05/19/2013 09:00:00 Appointment Scheduled Provider:  Sheliah Hatch.

## 2013-05-19 NOTE — Assessment & Plan Note (Signed)
New to provider.  Start Diflucan and OTC miconazole prn.  Reviewed supportive care and red flags that should prompt return.  Pt expressed understanding and is in agreement w/ plan.

## 2013-05-19 NOTE — Patient Instructions (Addendum)
Take the Diflucan 1 time (there are refills if this happens again) For external itching and burning you can use OTC Miconazole cream Call with any questions or concerns Hang in there!

## 2013-05-19 NOTE — Telephone Encounter (Signed)
Pt has an appt today w/ Dr. Beverely Low.

## 2013-05-19 NOTE — Progress Notes (Signed)
  Subjective:    Patient ID: Kelli Nielsen, female    DOB: 09-Mar-1936, 77 y.o.   MRN: [id number]  HPI Vaginal itching- 'i've got this yeast infxn'.  sxs started last night w/ itching and burning.  Hx of similar.  Pt recently seen in ER for IBS but doesn't believe she had recent abx.  Scant vaginal discharge.  Itching is both internal and external.   Review of Systems For ROS see HPI     Objective:   Physical Exam  Vitals reviewed. Constitutional: She appears well-developed and well-nourished. No distress.  Genitourinary: There is no rash (diffuse redness but no rash) or tenderness on the right labia. There is no rash or tenderness on the left labia. There is erythema (diffuse) around the vagina. No tenderness around the vagina. Vaginal discharge (scant) found.          Assessment & Plan:

## 2013-05-20 LAB — WET PREP BY MOLECULAR PROBE
Candida species: NEGATIVE
Trichomonas vaginosis: NEGATIVE

## 2013-05-26 ENCOUNTER — Telehealth: Payer: Self-pay | Admitting: *Deleted

## 2013-05-26 MED ORDER — METRONIDAZOLE 500 MG PO TABS: 500.0000 mg | ORAL_TABLET | Freq: Two times a day (BID) | ORAL | Status: DC

## 2013-05-26 NOTE — Telephone Encounter (Signed)
Message copied by Shirlee More I on Thu May 26, 2013  1:47 PM ------      Message from: Sheliah Hatch      Created: Fri May 20, 2013  8:12 AM       + for BV and not yeast.  Need to start Flagyl 500mg  bid x7 days ------

## 2013-05-26 NOTE — Telephone Encounter (Signed)
Pt. Returning Angie's call. Attempted to call pt. Back. LMOVM to return call to office.

## 2013-05-26 NOTE — Telephone Encounter (Signed)
Spoke with patient informed of recent lab results. Pt. Verbalized understanding. RX sent to pt. Pharmacy.

## 2014-05-05 ENCOUNTER — Encounter: Payer: Self-pay | Admitting: Internal Medicine

## 2014-05-05 ENCOUNTER — Other Ambulatory Visit (INDEPENDENT_AMBULATORY_CARE_PROVIDER_SITE_OTHER): Payer: Medicare Other

## 2014-05-05 ENCOUNTER — Other Ambulatory Visit: Payer: Self-pay | Admitting: Internal Medicine

## 2014-05-05 ENCOUNTER — Ambulatory Visit (INDEPENDENT_AMBULATORY_CARE_PROVIDER_SITE_OTHER): Payer: Medicare Other | Admitting: Internal Medicine

## 2014-05-05 VITALS — BP 160/76 | HR 72 | Temp 98.2°F | Ht 67.5 in | Wt 143.4 lb

## 2014-05-05 DIAGNOSIS — F32A Depression, unspecified: Secondary | ICD-10-CM

## 2014-05-05 DIAGNOSIS — D126 Benign neoplasm of colon, unspecified: Secondary | ICD-10-CM

## 2014-05-05 DIAGNOSIS — E782 Mixed hyperlipidemia: Secondary | ICD-10-CM | POA: Insufficient documentation

## 2014-05-05 DIAGNOSIS — R7309 Other abnormal glucose: Secondary | ICD-10-CM

## 2014-05-05 DIAGNOSIS — I8390 Asymptomatic varicose veins of unspecified lower extremity: Secondary | ICD-10-CM

## 2014-05-05 DIAGNOSIS — F418 Other specified anxiety disorders: Secondary | ICD-10-CM | POA: Insufficient documentation

## 2014-05-05 DIAGNOSIS — F329 Major depressive disorder, single episode, unspecified: Secondary | ICD-10-CM

## 2014-05-05 DIAGNOSIS — E785 Hyperlipidemia, unspecified: Secondary | ICD-10-CM

## 2014-05-05 DIAGNOSIS — R030 Elevated blood-pressure reading, without diagnosis of hypertension: Secondary | ICD-10-CM

## 2014-05-05 DIAGNOSIS — G479 Sleep disorder, unspecified: Secondary | ICD-10-CM

## 2014-05-05 DIAGNOSIS — F3289 Other specified depressive episodes: Secondary | ICD-10-CM

## 2014-05-05 LAB — CBC WITH DIFFERENTIAL/PLATELET
BASOS PCT: 0.4 % (ref 0.0–3.0)
Basophils Absolute: 0 10*3/uL (ref 0.0–0.1)
EOS ABS: 0 10*3/uL (ref 0.0–0.7)
Eosinophils Relative: 0.7 % (ref 0.0–5.0)
HCT: 44.9 % (ref 36.0–46.0)
Hemoglobin: 15.1 g/dL — ABNORMAL HIGH (ref 12.0–15.0)
Lymphocytes Relative: 31.5 % (ref 12.0–46.0)
Lymphs Abs: 1.9 10*3/uL (ref 0.7–4.0)
MCHC: 33.6 g/dL (ref 30.0–36.0)
MCV: 100.4 fl — AB (ref 78.0–100.0)
MONO ABS: 0.5 10*3/uL (ref 0.1–1.0)
Monocytes Relative: 7.5 % (ref 3.0–12.0)
NEUTROS PCT: 59.9 % (ref 43.0–77.0)
Neutro Abs: 3.6 10*3/uL (ref 1.4–7.7)
Platelets: 266 10*3/uL (ref 150.0–400.0)
RBC: 4.47 Mil/uL (ref 3.87–5.11)
RDW: 13.2 % (ref 11.5–15.5)
WBC: 6.1 10*3/uL (ref 4.0–10.5)

## 2014-05-05 LAB — HEPATIC FUNCTION PANEL
ALT: 18 U/L (ref 0–35)
AST: 25 U/L (ref 0–37)
Albumin: 4.8 g/dL (ref 3.5–5.2)
Alkaline Phosphatase: 46 U/L (ref 39–117)
BILIRUBIN DIRECT: 0.2 mg/dL (ref 0.0–0.3)
Total Bilirubin: 1.2 mg/dL (ref 0.2–1.2)
Total Protein: 8.1 g/dL (ref 6.0–8.3)

## 2014-05-05 LAB — BASIC METABOLIC PANEL
BUN: 16 mg/dL (ref 6–23)
CHLORIDE: 103 meq/L (ref 96–112)
CO2: 27 meq/L (ref 19–32)
CREATININE: 0.7 mg/dL (ref 0.4–1.2)
Calcium: 9.8 mg/dL (ref 8.4–10.5)
GFR: 86.45 mL/min (ref >60.00)
Glucose, Bld: 95 mg/dL (ref 70–99)
Potassium: 4 mEq/L (ref 3.5–5.1)
Sodium: 139 mEq/L (ref 135–145)

## 2014-05-05 LAB — TSH: TSH: 1.31 u[IU]/mL (ref 0.35–4.50)

## 2014-05-05 LAB — HEMOGLOBIN A1C: HEMOGLOBIN A1C: 5.4 % (ref 4.6–6.5)

## 2014-05-05 MED ORDER — ALPRAZOLAM 0.25 MG PO TABS: 0.2500 mg | ORAL_TABLET | Freq: Every evening | ORAL | Status: DC | PRN

## 2014-05-05 MED ORDER — METOPROLOL TARTRATE 25 MG PO TABS: 25.0000 mg | ORAL_TABLET | Freq: Two times a day (BID) | ORAL | Status: DC

## 2014-05-05 NOTE — Progress Notes (Signed)
Subjective:    Patient ID: Kelli Nielsen, female    DOB: Mar 07, 1936, 78 y.o.   MRN: [id number]  HPI  She is here to assess active health issues & conditions. PMH, FH, & Social history verified & updated .  She has no history of hypertension. She was rushing for this appointment. Also she is under a great deal of stress related to her husband's recently diagnosed movement disorder & her work demands.  She's had chronic sleep issues. She is taken 1/4 of 1 mg Xanax for years with good response. Her gynecologist had placed her on Prozac to which she had adverse reaction.  A heart healthy diet is followed; exercise encompasses walking @ work for up to 10 hours & yard work. without symptoms.  Family history is neg for premature coronary disease. No advanced cholesterol testing to date. To date no statin.  Low dose ASA not taken  Review of Systems   Specifically denied are  chest pain, palpitations, dyspnea, or claudication.  Significant abdominal symptoms, memory deficit, or myalgias not present.        Objective:   Physical Exam  Repeat BP 160/84 Gen.: Thin but healthy and well-nourished in appearance. Alert, appropriate and cooperative throughout exam. Appears younger than stated age  Head: Normocephalic without obvious abnormalities Eyes: No corneal or conjunctival inflammation noted. Pupils equal round reactive to light and accommodation. Extraocular motion intact.  Ears: External  ear exam reveals no significant lesions or deformities. Canals clear .TMs normal. Hearing is grossly normal bilaterally. Nose: External nasal exam reveals no deformity or inflammation. Nasal mucosa are pink and moist. No lesions or exudates noted.   Mouth: Oral mucosa and oropharynx reveal no lesions or exudates. Teeth in good repair. Neck: No deformities, masses, or tenderness noted. Range of motion & Thyroid normal. Lungs: Normal respiratory effort; chest expands symmetrically. Lungs are clear to  auscultation without rales, wheezes, or increased work of breathing. Heart: Normal rate and rhythm. Normal S1 and S2. No gallop, click, or rub. No murmur. Abdomen: Bowel sounds normal; abdomen soft and nontender. No masses, organomegaly or hernias noted.Aorta palpable ; no AAA Genitalia: as per Gyn                                  Musculoskeletal/extremities: No deformity or scoliosis noted of  the thoracic or lumbar spine. No clubbing, cyanosis, edema, or significant extremity  deformity noted. Range of motion normal .Tone & strength normal. Hand joints normal  Fingernail health good. Able to lie down & sit up w/o help. Negative SLR bilaterally Vascular: Carotid, radial artery, dorsalis pedis and  posterior tibial pulses are full and equal. No bruits present.Varicose veins RLE Neurologic: Alert and oriented x3. Deep tendon reflexes symmetrical and normal.  Gait normal        Skin: Intact without suspicious lesions or rashes. Lymph: No cervical, axillary lymphadenopathy present. Psych: Mood and affect :slightly anxious. Normally interactive  Assessment & Plan:  See Current Assessment & Plan in Problem List under specific DiagnosisThe labs will be reviewed and risks and options assessed. Written recommendations will be provided by mail or directly through My Chart.Further evaluation or change in medical therapy will be directed by those results.

## 2014-05-05 NOTE — Assessment & Plan Note (Signed)
TSH Refill Xanax @ qhs

## 2014-05-05 NOTE — Assessment & Plan Note (Signed)
Blood pressure goals reviewed. BMET 

## 2014-05-05 NOTE — Assessment & Plan Note (Signed)
A1c

## 2014-05-05 NOTE — Patient Instructions (Signed)
Your next office appointment will be determined based upon review of your pending labs . Those instructions will be transmitted to you by mail.  Minimal Blood Pressure Goal= AVERAGE < 140/90;  Ideal is an AVERAGE < 135/85. This AVERAGE should be calculated from @ least 5-7 BP readings taken @ different times of day on different days of week. You should not respond to isolated BP readings , but rather the AVERAGE for that week .Please bring your  blood pressure cuff to office visits to verify that it is reliable.It  can also be checked against the blood pressure device at the pharmacy. Finger or wrist cuffs are not dependable; an arm cuff is.Fill the  prescription for the BP medication if BP NOT @ goal based on  7 to 14 day average.

## 2014-05-05 NOTE — Assessment & Plan Note (Signed)
Lipids, LFTs, TSH  

## 2014-05-05 NOTE — Progress Notes (Signed)
Pre visit review using our clinic review tool, if applicable. No additional management support is needed unless otherwise documented below in the visit note. 

## 2014-05-08 LAB — NMR LIPOPROFILE WITH LIPIDS
CHOLESTEROL, TOTAL: 197 mg/dL (ref <200)
HDL Particle Number: 51 umol/L (ref >=30.5)
HDL Size: 9.7 nm (ref >=9.2)
HDL-C: 94 mg/dL (ref >=40)
LDL CALC: 93 mg/dL (ref <100)
LDL PARTICLE NUMBER: 1021 nmol/L — AB (ref <1000)
LDL Size: 20.7 nm (ref >20.5)
LP-IR SCORE: 31 (ref <=45)
Large HDL-P: 14.4 umol/L (ref >=4.8)
Large VLDL-P: 1.8 nmol/L (ref <=2.7)
SMALL LDL PARTICLE NUMBER: 378 nmol/L (ref <=527)
Triglycerides: 51 mg/dL (ref <150)
VLDL SIZE: 45.2 nm (ref <=46.6)

## 2014-05-13 ENCOUNTER — Encounter: Payer: Self-pay | Admitting: Internal Medicine

## 2014-06-23 ENCOUNTER — Other Ambulatory Visit: Payer: Self-pay | Admitting: *Deleted

## 2014-06-23 DIAGNOSIS — I83893 Varicose veins of bilateral lower extremities with other complications: Secondary | ICD-10-CM

## 2014-07-18 ENCOUNTER — Encounter: Payer: Medicare Other | Admitting: Vascular Surgery

## 2014-07-18 ENCOUNTER — Encounter (HOSPITAL_COMMUNITY): Payer: Medicare Other

## 2014-08-15 ENCOUNTER — Ambulatory Visit (INDEPENDENT_AMBULATORY_CARE_PROVIDER_SITE_OTHER): Payer: Medicare Other

## 2014-08-15 DIAGNOSIS — Z23 Encounter for immunization: Secondary | ICD-10-CM

## 2014-11-06 ENCOUNTER — Telehealth: Payer: Self-pay

## 2014-11-06 NOTE — Telephone Encounter (Signed)
No; she has Penicillin allergy Hydromet can be Rxed; OK if no better

## 2014-11-06 NOTE — Telephone Encounter (Signed)
Patient states she does not wish to have a prescription rx'd to her.

## 2014-11-06 NOTE — Telephone Encounter (Signed)
Patient would like to know if it's to take her husbands Amoxicillin that he has not even opened since he did not need it. Her symptoms are cough, temp off and on.

## 2014-11-07 ENCOUNTER — Encounter: Payer: Self-pay | Admitting: Family

## 2014-11-07 ENCOUNTER — Ambulatory Visit (INDEPENDENT_AMBULATORY_CARE_PROVIDER_SITE_OTHER)
Admission: RE | Admit: 2014-11-07 | Discharge: 2014-11-07 | Disposition: A | Discharge status: Home / Self Care | Payer: Medicare Other | Source: Ambulatory Visit | Attending: Family | Admitting: Family

## 2014-11-07 ENCOUNTER — Telehealth: Payer: Self-pay | Admitting: Family

## 2014-11-07 ENCOUNTER — Ambulatory Visit: Payer: Medicare Other | Admitting: Family

## 2014-11-07 ENCOUNTER — Ambulatory Visit (INDEPENDENT_AMBULATORY_CARE_PROVIDER_SITE_OTHER): Payer: Medicare Other | Admitting: Family

## 2014-11-07 VITALS — BP 156/74 | HR 106 | Temp 98.9°F | Resp 18 | Ht 67.5 in | Wt 138.0 lb

## 2014-11-07 DIAGNOSIS — R059 Cough, unspecified: Secondary | ICD-10-CM

## 2014-11-07 DIAGNOSIS — R05 Cough: Secondary | ICD-10-CM

## 2014-11-07 MED ORDER — LEVOFLOXACIN 500 MG PO TABS: 500.0000 mg | ORAL_TABLET | Freq: Every day | ORAL | Status: DC

## 2014-11-07 MED ORDER — BENZONATATE 100 MG PO CAPS: 100.0000 mg | ORAL_CAPSULE | Freq: Three times a day (TID) | ORAL | Status: DC | PRN

## 2014-11-07 NOTE — Telephone Encounter (Signed)
Please call patient and inform her that her chest x-ray does not show pneumonia at this time. Please have her continue to take the antibiotic and follow up if symptoms worsen or fail to improve.

## 2014-11-07 NOTE — Progress Notes (Signed)
Pre visit review using our clinic review tool, if applicable. No additional management support is needed unless otherwise documented below in the visit note. 

## 2014-11-07 NOTE — Patient Instructions (Signed)
Thank you for choosing [facility].  Summary/Instructions:  Your prescription(s) have been submitted to your pharmacy. Please take as directed and contact our office if you believe you are having problem(s) with the medication(s). == Please stop by radiology on the basement level of the building for your x-rays. Your results will be released to [facility] (or called to you) after review, usually within 72hours after test completion. If any changes need to be made, you will be notified at that same time.  If your symptoms worsen or fail to improve, please contact our office for further instruction, or in case of emergency go directly to the emergency room at the closest medical facility.   OTC Medications:  Cough - Robatussin-DM, Delsym Congestion - Flonase, Coriciden HBP Fever/Body Aches - Tylenol  Pneumonia Pneumonia is an infection of the lungs.  CAUSES Pneumonia may be caused by bacteria or a virus. Usually, these infections are caused by breathing infectious particles into the lungs (respiratory tract). SIGNS AND SYMPTOMS   Cough.  Fever.  Chest pain.  Increased rate of breathing.  Wheezing.  Mucus production. DIAGNOSIS  If you have the common symptoms of pneumonia, your health care provider will typically confirm the diagnosis with a chest X-ray. The X-ray will show an abnormality in the lung (pulmonary infiltrate) if you have pneumonia. Other tests of your blood, urine, or sputum may be done to find the specific cause of your pneumonia. Your health care provider may also do tests (blood gases or pulse oximetry) to see how well your lungs are working. TREATMENT  Some forms of pneumonia may be spread to other people when you cough or sneeze. You may be asked to wear a mask before and during your exam. Pneumonia that is caused by bacteria is treated with antibiotic medicine. Pneumonia that is caused by the influenza virus may be treated with an antiviral medicine. Most  other viral infections must run their course. These infections will not respond to antibiotics.  HOME CARE INSTRUCTIONS   Cough suppressants may be used if you are losing too much rest. However, coughing protects you by clearing your lungs. You should avoid using cough suppressants if you can.  Your health care provider may have prescribed medicine if he or she thinks your pneumonia is caused by bacteria or influenza. Finish your medicine even if you start to feel better.  Your health care provider may also prescribe an expectorant. This loosens the mucus to be coughed up.  Take medicines only as directed by your health care provider.  Do not smoke. Smoking is a common cause of bronchitis and can contribute to pneumonia. If you are a smoker and continue to smoke, your cough may last several weeks after your pneumonia has cleared.  A cold steam vaporizer or humidifier in your room or home may help loosen mucus.  Coughing is often worse at night. Sleeping in a semi-upright position in a recliner or using a couple pillows under your head will help with this.  Get rest as you feel it is needed. Your body will usually let you know when you need to rest. PREVENTION A pneumococcal shot (vaccine) is available to prevent a common bacterial cause of pneumonia. This is usually suggested for:  People over 53 years old.  Patients on chemotherapy.  People with chronic lung problems, such as bronchitis or emphysema.  People with immune system problems. If you are over 65 or have a high risk condition, you may receive the pneumococcal vaccine if  you have not received it before. In some countries, a routine influenza vaccine is also recommended. This vaccine can help prevent some cases of pneumonia.You may be offered the influenza vaccine as part of your care. If you smoke, it is time to quit. You may receive instructions on how to stop smoking. Your health care provider can provide medicines and  counseling to help you quit. SEEK MEDICAL CARE IF: You have a fever. SEEK IMMEDIATE MEDICAL CARE IF:   Your illness becomes worse. This is especially true if you are elderly or weakened from any other disease.  You cannot control your cough with suppressants and are losing sleep.  You begin coughing up blood.  You develop pain which is getting worse or is uncontrolled with medicines.  Any of the symptoms which initially brought you in for treatment are getting worse rather than better.  You develop shortness of breath or chest pain. MAKE SURE YOU:   Understand these instructions.  Will watch your condition.  Will get help right away if you are not doing well or get worse. Document Released: 11/10/2005 Document Revised: 03/27/2014 Document Reviewed: 01/30/2011 [facility] Patient Information 2015 [place], [place]. This information is not intended to replace advice given to you by your health care provider. Make sure you discuss any questions you have with your health care provider.

## 2014-11-07 NOTE — Telephone Encounter (Signed)
Pt aware and will give [place] a call if things don't get any better.

## 2014-11-07 NOTE — Assessment & Plan Note (Signed)
Given questionable rales, obtain chest x-ray to rule out PNA. Start levofloxacin. Cannot rule out influenza and no office tests were available for confirmation. Continue over the counter medications as needed for symptom relief. Rest, drink plenty of fluids. Follow up if symptoms worsen or fail to improve.

## 2014-11-07 NOTE — Progress Notes (Signed)
   Subjective:    Patient ID: Kelli Nielsen, female    DOB: 1936/01/24, 78 y.o.   MRN: [id number]  Chief Complaint  Patient presents with  . Nasal Congestion    cough, congestion, body aches, fever chills, x5 days says she feels so bad that its hard for her to get out of bed    HPI:  Kelli Nielsen is a 78 y.o. female who presents today for an acute visit.   Acute symptoms of cough, congestion, body aches, and fever with chills have been going on for approximately 5 days. Over the course of the last 5 days there has been no improvement. Denies any shortness of breath, nausea or vomiting. Has taken a Coriciden HBP and cough medicine (Hycodan). Symptoms tend to be worse at night.   Allergies  Allergen Reactions  . Azithromycin     REACTION: elevated liver enzymes  . Penicillins Hives    Hives    Current Outpatient Prescriptions on File Prior to Visit  Medication Sig Dispense Refill  . ALPRAZolam (XANAX) 0.25 MG tablet Take 1 tablet (0.25 mg total) by mouth at bedtime as needed for anxiety. 90 tablet 0  . metoprolol tartrate (LOPRESSOR) 25 MG tablet Take 1 tablet (25 mg total) by mouth 2 (two) times daily. 60 tablet 2  . Probiotic Product (ALIGN) 4 MG CAPS Take 1 capsule by mouth daily.       No current facility-administered medications on file prior to visit.   Review of Systems    See HPI  Objective:    BP 156/74 mmHg  Pulse 106  Temp(Src) 98.9 F (37.2 C) (Oral)  Resp 18  Ht 5' 7.5" (1.715 m)  Wt 138 lb (62.596 kg)  BMI 21.28 kg/m2  SpO2 96% Nursing note and vital signs reviewed.  Physical Exam  Constitutional: She is oriented to person, place, and time. She appears well-developed and well-nourished. No distress.  Ill appearing female, dressed appropriately, and appears stated age.   HENT:  Right Ear: Hearing, tympanic membrane, external ear and ear canal normal.  Left Ear: Hearing, tympanic membrane, external ear and ear canal normal.  Nose: Right sinus exhibits no  maxillary sinus tenderness and no frontal sinus tenderness. Left sinus exhibits no maxillary sinus tenderness and no frontal sinus tenderness.  Mouth/Throat: Uvula is midline, oropharynx is clear and moist and mucous membranes are normal.  Cardiovascular: Normal rate, regular rhythm, normal heart sounds and intact distal pulses.   Pulmonary/Chest: Effort normal. She has rales (Questionable rales in lower right lobe).  Neurological: She is alert and oriented to person, place, and time.  Skin: Skin is warm and dry.  Psychiatric: She has a normal mood and affect. Her behavior is normal. Judgment and thought content normal.       Assessment & Plan:

## 2014-11-19 ENCOUNTER — Other Ambulatory Visit: Payer: Self-pay | Admitting: Internal Medicine

## 2014-11-20 ENCOUNTER — Other Ambulatory Visit: Payer: Self-pay | Admitting: Internal Medicine

## 2014-11-20 NOTE — Telephone Encounter (Signed)
Alprazolam has been called to [facility] on [address]

## 2014-11-20 NOTE — Telephone Encounter (Signed)
OK X1 

## 2015-04-25 ENCOUNTER — Other Ambulatory Visit: Payer: Self-pay | Admitting: Internal Medicine

## 2015-04-25 NOTE — Telephone Encounter (Signed)
Refill called into pharmacy spoke with Joe gave md approval.../lmb

## 2015-04-25 NOTE — Telephone Encounter (Signed)
OK X1 

## 2015-04-27 ENCOUNTER — Other Ambulatory Visit: Payer: Self-pay | Admitting: Internal Medicine

## 2015-04-30 NOTE — Telephone Encounter (Signed)
OK x 1 

## 2015-05-01 NOTE — Telephone Encounter (Signed)
Called refill into pharmacy spoke with Joe gave md approval.../lmb

## 2015-07-06 ENCOUNTER — Other Ambulatory Visit: Payer: Self-pay | Admitting: Internal Medicine

## 2015-07-11 ENCOUNTER — Other Ambulatory Visit: Payer: Self-pay | Admitting: Internal Medicine

## 2015-07-12 NOTE — Telephone Encounter (Signed)
Last seen 12/15, last refill 05/01/15

## 2015-07-15 ENCOUNTER — Other Ambulatory Visit: Payer: Self-pay | Admitting: Internal Medicine

## 2015-07-18 ENCOUNTER — Other Ambulatory Visit: Payer: Self-pay | Admitting: Internal Medicine

## 2015-07-19 ENCOUNTER — Other Ambulatory Visit: Payer: Self-pay | Admitting: Emergency Medicine

## 2015-07-19 MED ORDER — ALPRAZOLAM 0.25 MG PO TABS: ORAL_TABLET | ORAL | Status: DC

## 2015-07-19 NOTE — Telephone Encounter (Signed)
Xanax RX faxed to pharm

## 2015-07-19 NOTE — Telephone Encounter (Signed)
Please advise, thanks.

## 2015-07-19 NOTE — Telephone Encounter (Signed)
OK X1  My retirement date is 11/24/2015; but I will be in office on a limited schedule Oct-Dec. To guarantee continuity of care you should transition your care to another PCP by Oct 1,2016.     

## 2015-08-17 ENCOUNTER — Ambulatory Visit (INDEPENDENT_AMBULATORY_CARE_PROVIDER_SITE_OTHER): Payer: Medicare Other

## 2015-08-17 DIAGNOSIS — Z23 Encounter for immunization: Secondary | ICD-10-CM

## 2015-09-13 ENCOUNTER — Other Ambulatory Visit: Payer: Self-pay | Admitting: Internal Medicine

## 2015-09-16 ENCOUNTER — Other Ambulatory Visit: Payer: Self-pay | Admitting: Internal Medicine

## 2015-09-18 ENCOUNTER — Other Ambulatory Visit: Payer: Self-pay | Admitting: Emergency Medicine

## 2015-09-18 MED ORDER — ALPRAZOLAM 0.25 MG PO TABS: ORAL_TABLET | ORAL | Status: DC

## 2015-09-24 ENCOUNTER — Encounter: Payer: Self-pay | Admitting: Gastroenterology

## 2016-03-13 ENCOUNTER — Ambulatory Visit (INDEPENDENT_AMBULATORY_CARE_PROVIDER_SITE_OTHER): Payer: Medicare Other | Admitting: Family

## 2016-03-13 ENCOUNTER — Other Ambulatory Visit: Payer: Medicare Other

## 2016-03-13 ENCOUNTER — Encounter: Payer: Self-pay | Admitting: Family

## 2016-03-13 VITALS — BP 130/80 | HR 79 | Temp 97.8°F | Ht 67.5 in | Wt 131.0 lb

## 2016-03-13 DIAGNOSIS — R300 Dysuria: Secondary | ICD-10-CM

## 2016-03-13 LAB — POCT URINALYSIS DIPSTICK
BILIRUBIN UA: NEGATIVE
Blood, UA: NEGATIVE
Glucose, UA: NEGATIVE
KETONES UA: NEGATIVE
LEUKOCYTES UA: NEGATIVE
Nitrite, UA: NEGATIVE
PH UA: 6
Protein, UA: NEGATIVE
Spec Grav, UA: 1.02
Urobilinogen, UA: 0.2

## 2016-03-13 MED ORDER — PHENAZOPYRIDINE HCL 100 MG PO TABS: 100.0000 mg | ORAL_TABLET | Freq: Three times a day (TID) | ORAL | Status: DC | PRN

## 2016-03-13 MED ORDER — SULFAMETHOXAZOLE-TRIMETHOPRIM 800-160 MG PO TABS
1.0000 | ORAL_TABLET | Freq: Two times a day (BID) | ORAL | Status: DC
Start: 2016-03-13 — End: 2016-03-13

## 2016-03-13 MED ORDER — SULFAMETHOXAZOLE-TRIMETHOPRIM 800-160 MG PO TABS: 1.0000 | ORAL_TABLET | Freq: Two times a day (BID) | ORAL | Status: DC

## 2016-03-13 NOTE — Progress Notes (Signed)
Subjective:    Patient ID: Kelli Nielsen, female    DOB: 08-01-1936, 80 y.o.   MRN: [id number]   Kelli Nielsen is a 80 y.o. female who presents today for an acute visit.    Urinary Tract Infection  This is a new problem. The current episode started today. The problem occurs intermittently. The problem has been unchanged. The quality of the pain is described as burning. The pain is mild. There has been no fever. She is not sexually active. There is no history of pyelonephritis. Pertinent negatives include no chills, discharge, flank pain, frequency, hematuria, nausea, urgency or vomiting. She has tried nothing for the symptoms. The treatment provided no relief. There is no history of kidney stones, recurrent UTIs or a urological procedure.   Past Medical History  Diagnosis Date  . Adenomatous colon polyp    Azithromycin and Penicillins Current Outpatient Prescriptions on File Prior to Visit  Medication Sig Dispense Refill  . ALPRAZolam (XANAX) 0.25 MG tablet TAKE ONE TABLET BY MOUTH AT BEDTIME AS NEEDED FOR ANXIETY--- Must have office visit for further refills. 30 tablet 0  . Probiotic Product (ALIGN) 4 MG CAPS Take 1 capsule by mouth daily.      . benzonatate (TESSALON) 100 MG capsule Take 1 capsule (100 mg total) by mouth 3 (three) times daily as needed for cough. (Patient not taking: Reported on 03/13/2016) 20 capsule 0  . metoprolol tartrate (LOPRESSOR) 25 MG tablet Take 1 tablet (25 mg total) by mouth 2 (two) times daily. (Patient not taking: Reported on 03/13/2016) 60 tablet 2   No current facility-administered medications on file prior to visit.    Social History  Substance Use Topics  . Smoking status: Never Smoker   . Smokeless tobacco: Never Used  . Alcohol Use: 1.2 oz/week    2 Glasses of wine per week    Review of Systems  Constitutional: Negative for fever, chills and unexpected weight change.  HENT: Negative for congestion, ear pain, sinus pressure and sore  throat.   Eyes: Negative for visual disturbance.  Respiratory: Negative for cough, shortness of breath and wheezing.   Cardiovascular: Negative for chest pain, palpitations and leg swelling.  Gastrointestinal: Negative for nausea and vomiting.  Genitourinary: Negative for urgency, frequency, hematuria and flank pain.  Musculoskeletal: Negative for myalgias and arthralgias.  Skin: Negative for rash.  Neurological: Negative for headaches.  Hematological: Negative for adenopathy.      Objective:    BP 130/80 mmHg  Pulse 79  Temp(Src) 97.8 F (36.6 C) (Oral)  Ht 5' 7.5" (1.715 m)  Wt 131 lb (59.421 kg)  BMI 20.20 kg/m2  SpO2 93%   Physical Exam  Constitutional: She appears well-developed and well-nourished.  Cardiovascular: Normal rate, regular rhythm, normal heart sounds and normal pulses.   Pulmonary/Chest: Effort normal and breath sounds normal. She has no wheezes. She has no rhonchi. She has no rales.  Abdominal: There is no CVA tenderness.  Neurological: She is alert.  Skin: Skin is warm and dry.  Psychiatric: She has a normal mood and affect. Her speech is normal and behavior is normal. Thought content normal.  Vitals reviewed.      Assessment & Plan:   1. Dysuria UA is negative for white blood cells, leukocyte esterase or nitrites. However patient and I decided to treat on symptoms. Pending culture.  - phenazopyridine (PYRIDIUM) 100 MG tablet; Take 1 tablet (100 mg total) by mouth 3 (three) times daily as needed for  pain.  Dispense: 8 tablet; Refill: 0 - sulfamethoxazole-trimethoprim (BACTRIM DS,SEPTRA DS) 800-160 MG tablet; Take 1 tablet by mouth 2 (two) times daily.  Dispense: 6 tablet; Refill: 0   I have discontinued Ms. Prunty levofloxacin. I am also having her maintain her ALIGN, metoprolol tartrate, benzonatate, ALPRAZolam, phenazopyridine, and sulfamethoxazole-trimethoprim.   Meds ordered this encounter  Medications  . DISCONTD: phenazopyridine (PYRIDIUM)  100 MG tablet    Sig: Take 1 tablet (100 mg total) by mouth 3 (three) times daily as needed for pain.    Dispense:  8 tablet    Refill:  0    Order Specific Question:  Supervising Provider    Answer:  Cassandria Anger [[id number]]  . DISCONTD: sulfamethoxazole-trimethoprim (BACTRIM DS,SEPTRA DS) 800-160 MG tablet    Sig: Take 1 tablet by mouth 2 (two) times daily.    Dispense:  6 tablet    Refill:  0    Order Specific Question:  Supervising Provider    Answer:  Cassandria Anger [[id number]]  . phenazopyridine (PYRIDIUM) 100 MG tablet    Sig: Take 1 tablet (100 mg total) by mouth 3 (three) times daily as needed for pain.    Dispense:  8 tablet    Refill:  0    Order Specific Question:  Supervising Provider    Answer:  Cassandria Anger [[id number]]  . sulfamethoxazole-trimethoprim (BACTRIM DS,SEPTRA DS) 800-160 MG tablet    Sig: Take 1 tablet by mouth 2 (two) times daily.    Dispense:  6 tablet    Refill:  0    Order Specific Question:  Supervising Provider    Answer:  Cassandria Anger [[id number]]     Start medications as prescribed and explained to patient on After Visit Summary ( AVS). Risks, benefits, and alternatives of the medications and treatment plan prescribed today were discussed, and patient expressed understanding.   Education regarding symptom management and diagnosis given to patient.   Follow-up:Plan follow-up as discussed or as needed if any worsening symptoms or change in condition.  Continue to follow with Mauricio Po, FNP for routine health maintenance.   Kelli Nielsen and I agreed with plan.   Kelli Paris, FNP

## 2016-03-13 NOTE — Addendum Note (Signed)
Addended by: Aviva Signs M on: 03/13/2016 02:16 PM   Modules accepted: Orders, SmartSet

## 2016-03-13 NOTE — Progress Notes (Signed)
Pre visit review using our clinic review tool, if applicable. No additional management support is needed unless otherwise documented below in the visit note. 

## 2016-03-13 NOTE — Addendum Note (Signed)
Addended by: Kateri Mc E on: 03/13/2016 02:18 PM   Modules accepted: Miquel Dunn

## 2016-03-13 NOTE — Patient Instructions (Signed)
Please make CPE for patient with PCP. Fluids.   If there is no improvement in your symptoms, or if there is any worsening of symptoms, or if you have any additional concerns, please return for re-evaluation; or, if we are closed, consider going to the Emergency Room for evaluation if symptoms urgent.  Urinary Tract Infection Urinary tract infections (UTIs) can develop anywhere along your urinary tract. Your urinary tract is your body's drainage system for removing wastes and extra water. Your urinary tract includes two kidneys, two ureters, a bladder, and a urethra. Your kidneys are a pair of bean-shaped organs. Each kidney is about the size of your fist. They are located below your ribs, one on each side of your spine. CAUSES Infections are caused by microbes, which are microscopic organisms, including fungi, viruses, and bacteria. These organisms are so small that they can only be seen through a microscope. Bacteria are the microbes that most commonly cause UTIs. SYMPTOMS  Symptoms of UTIs may vary by age and gender of the patient and by the location of the infection. Symptoms in young women typically include a frequent and intense urge to urinate and a painful, burning feeling in the bladder or urethra during urination. Older women and men are more likely to be tired, shaky, and weak and have muscle aches and abdominal pain. A fever may mean the infection is in your kidneys. Other symptoms of a kidney infection include pain in your back or sides below the ribs, nausea, and vomiting. DIAGNOSIS To diagnose a UTI, your caregiver will ask you about your symptoms. Your caregiver will also ask you to provide a urine sample. The urine sample will be tested for bacteria and white blood cells. White blood cells are made by your body to help fight infection. TREATMENT  Typically, UTIs can be treated with medication. Because most UTIs are caused by a bacterial infection, they usually can be treated with the  use of antibiotics. The choice of antibiotic and length of treatment depend on your symptoms and the type of bacteria causing your infection. HOME CARE INSTRUCTIONS  If you were prescribed antibiotics, take them exactly as your caregiver instructs you. Finish the medication even if you feel better after you have only taken some of the medication.  Drink enough water and fluids to keep your urine clear or pale yellow.  Avoid caffeine, tea, and carbonated beverages. They tend to irritate your bladder.  Empty your bladder often. Avoid holding urine for long periods of time.  Empty your bladder before and after sexual intercourse.  After a bowel movement, women should cleanse from front to back. Use each tissue only once. SEEK MEDICAL CARE IF:   You have back pain.  You develop a fever.  Your symptoms do not begin to resolve within 3 days. SEEK IMMEDIATE MEDICAL CARE IF:   You have severe back pain or lower abdominal pain.  You develop chills.  You have nausea or vomiting.  You have continued burning or discomfort with urination. MAKE SURE YOU:   Understand these instructions.  Will watch your condition.  Will get help right away if you are not doing well or get worse.   This information is not intended to replace advice given to you by your health care provider. Make sure you discuss any questions you have with your health care provider.   Document Released: 08/20/2005 Document Revised: 08/01/2015 Document Reviewed: 12/19/2011 Elsevier Interactive Patient Education [employer].

## 2016-03-15 LAB — CULTURE, URINE COMPREHENSIVE
COLONY COUNT: NO GROWTH
ORGANISM ID, BACTERIA: NO GROWTH

## 2016-03-27 ENCOUNTER — Other Ambulatory Visit (INDEPENDENT_AMBULATORY_CARE_PROVIDER_SITE_OTHER): Payer: Medicare Other

## 2016-03-27 ENCOUNTER — Telehealth: Payer: Self-pay | Admitting: Family

## 2016-03-27 ENCOUNTER — Encounter: Payer: Self-pay | Admitting: Family

## 2016-03-27 ENCOUNTER — Ambulatory Visit (INDEPENDENT_AMBULATORY_CARE_PROVIDER_SITE_OTHER): Payer: Medicare Other | Admitting: Family

## 2016-03-27 VITALS — BP 154/70 | HR 68 | Temp 97.7°F | Resp 16 | Ht 67.5 in | Wt 130.8 lb

## 2016-03-27 DIAGNOSIS — Z23 Encounter for immunization: Secondary | ICD-10-CM

## 2016-03-27 DIAGNOSIS — Z0001 Encounter for general adult medical examination with abnormal findings: Secondary | ICD-10-CM | POA: Insufficient documentation

## 2016-03-27 DIAGNOSIS — Z0000 Encounter for general adult medical examination without abnormal findings: Secondary | ICD-10-CM | POA: Diagnosis not present

## 2016-03-27 LAB — COMPREHENSIVE METABOLIC PANEL
ALK PHOS: 41 U/L (ref 39–117)
ALT: 15 U/L (ref 0–35)
AST: 22 U/L (ref 0–37)
Albumin: 4.3 g/dL (ref 3.5–5.2)
BUN: 16 mg/dL (ref 6–23)
CALCIUM: 9.3 mg/dL (ref 8.4–10.5)
CO2: 27 mEq/L (ref 19–32)
Chloride: 103 mEq/L (ref 96–112)
Creatinine, Ser: 0.62 mg/dL (ref 0.40–1.20)
GFR: 98.95 mL/min (ref >60.00)
Glucose, Bld: 99 mg/dL (ref 70–99)
Potassium: 3.8 mEq/L (ref 3.5–5.1)
Sodium: 139 mEq/L (ref 135–145)
TOTAL PROTEIN: 7 g/dL (ref 6.0–8.3)
Total Bilirubin: 1 mg/dL (ref 0.2–1.2)

## 2016-03-27 LAB — CBC
HEMATOCRIT: 40.6 % (ref 36.0–46.0)
HEMOGLOBIN: 13.9 g/dL (ref 12.0–15.0)
MCHC: 34.2 g/dL (ref 30.0–36.0)
MCV: 98.6 fl (ref 78.0–100.0)
PLATELETS: 230 10*3/uL (ref 150.0–400.0)
RBC: 4.12 Mil/uL (ref 3.87–5.11)
RDW: 12.9 % (ref 11.5–15.5)
WBC: 4.1 10*3/uL (ref 4.0–10.5)

## 2016-03-27 LAB — LIPID PANEL
Cholesterol: 178 mg/dL (ref 0–200)
HDL: 86.1 mg/dL (ref >39.00)
LDL Cholesterol: 81 mg/dL (ref 0–99)
NonHDL: 91.69
TRIGLYCERIDES: 54 mg/dL (ref 0.0–149.0)
Total CHOL/HDL Ratio: 2
VLDL: 10.8 mg/dL (ref 0.0–40.0)

## 2016-03-27 LAB — VITAMIN D 25 HYDROXY (VIT D DEFICIENCY, FRACTURES): VITD: 26.93 ng/mL — ABNORMAL LOW (ref 30.00–100.00)

## 2016-03-27 MED ORDER — ALPRAZOLAM 0.25 MG PO TABS: ORAL_TABLET | ORAL | Status: DC

## 2016-03-27 NOTE — Assessment & Plan Note (Signed)
1) Anticipatory Guidance: Discussed importance of wearing a seatbelt while driving and not texting while driving; changing batteries in smoke detector at least once annually; wearing suntan lotion when outside; eating a balanced and moderate diet; getting physical activity at least 30 minutes per day.  2) Immunizations / Screenings / Labs:  Prevnar updated today. Declines Zostavax and will research. All other immunizations are up-to-date per recommendations. Obtain vitamin D for vitamin D deficiency screening. All other screenings are up-to-date per recommendations. Obtain CBC, CMET, and Lipid profile.  Overall well exam with risk factors for cardiovascular disease including elevated blood pressure readings and hyperlipidemia. Currently managed through lifestyle including nutrition and physical activity. She does have anxiety secondary to family stressors with spouse diagnosis of PSP. Continue other healthy lifestyle behaviors and choices. Follow-up prevention exam in 1 year. Follow-up office visit as needed pending blood work.

## 2016-03-27 NOTE — Telephone Encounter (Signed)
Please inform patient that her kidney function, liver function, electrolytes, and white and red blood cells are all within the normal ranges. Her vitamin D levels remain slightly low with the recommendation for vitamin D supplementation of Vitamin D3 2000 units daily. Otherwise continue to take other supplements and medications as prescribed. Please have her follow up in 3 weeks for a blood pressure check via a nurse visit.

## 2016-03-27 NOTE — Progress Notes (Signed)
Pre visit review using our clinic review tool, if applicable. No additional management support is needed unless otherwise documented below in the visit note. 

## 2016-03-27 NOTE — Patient Instructions (Signed)
Thank you for choosing [facility].  Summary/Instructions:  Your prescription(s) have been submitted to your pharmacy or been printed and provided for you. Please take as directed and contact our office if you believe you are having problem(s) with the medication(s) or have any questions.  Please stop by the lab on the basement level of the building for your blood work. Your results will be released to [place] (or called to you) after review, usually within 72 hours after test completion. If any changes need to be made, you will be notified at that same time.  If your symptoms worsen or fail to improve, please contact our office for further instruction, or in case of emergency go directly to the emergency room at the closest medical facility.   Health Maintenance  Topic Date Due  . ZOSTAVAX  04/03/1998  . PNA vac Low Risk Adult (2 of 2 - PCV13) 09/04/2009  . INFLUENZA VACCINE  06/24/2016  . TETANUS/TDAP  11/20/2021  . DEXA SCAN  Completed    Health Maintenance, Female Adopting a healthy lifestyle and getting preventive care can go a long way to promote health and wellness. Talk with your health care provider about what schedule of regular examinations is right for you. This is a good chance for you to check in with your provider about disease prevention and staying healthy. In between checkups, there are plenty of things you can do on your own. Experts have done a lot of research about which lifestyle changes and preventive measures are most likely to keep you healthy. Ask your health care provider for more information. WEIGHT AND DIET  Eat a healthy diet  Be sure to include plenty of vegetables, fruits, low-fat dairy products, and lean protein.  Do not eat a lot of foods high in solid fats, added sugars, or salt.  Get regular exercise. This is one of the most important things you can do for your health.  Most adults should exercise for at least 150 minutes each week. The  exercise should increase your heart rate and make you sweat (moderate-intensity exercise).  Most adults should also do strengthening exercises at least twice a week. This is in addition to the moderate-intensity exercise.  Maintain a healthy weight  Body mass index (BMI) is a measurement that can be used to identify possible weight problems. It estimates body fat based on height and weight. Your health care provider can help determine your BMI and help you achieve or maintain a healthy weight.  For females 42 years of age and older:   A BMI below 18.5 is considered underweight.  A BMI of 18.5 to 24.9 is normal.  A BMI of 25 to 29.9 is considered overweight.  A BMI of 30 and above is considered obese.  Watch levels of cholesterol and blood lipids  You should start having your blood tested for lipids and cholesterol at 78 years of age, then have this test every 5 years.  You may need to have your cholesterol levels checked more often if:  Your lipid or cholesterol levels are high.  You are older than 78 years of age.  You are at high risk for heart disease.  CANCER SCREENING   Lung Cancer  Lung cancer screening is recommended for adults 41-54 years old who are at high risk for lung cancer because of a history of smoking.  A yearly low-dose CT scan of the lungs is recommended for people who:  Currently smoke.  Have quit within the  past 15 years.  Have at least a 30-pack-year history of smoking. A pack year is smoking an average of one pack of cigarettes a day for 1 year.  Yearly screening should continue until it has been 15 years since you quit.  Yearly screening should stop if you develop a health problem that would prevent you from having lung cancer treatment.  Breast Cancer  Practice breast self-awareness. This means understanding how your breasts normally appear and feel.  It also means doing regular breast self-exams. Let your health care provider know about  any changes, no matter how small.  If you are in your 20s or 30s, you should have a clinical breast exam (CBE) by a health care provider every 1-3 years as part of a regular health exam.  If you are 57 or older, have a CBE every year. Also consider having a breast X-ray (mammogram) every year.  If you have a family history of breast cancer, talk to your health care provider about genetic screening.  If you are at high risk for breast cancer, talk to your health care provider about having an MRI and a mammogram every year.  Breast cancer gene (BRCA) assessment is recommended for women who have family members with BRCA-related cancers. BRCA-related cancers include:  Breast.  Ovarian.  Tubal.  Peritoneal cancers.  Results of the assessment will determine the need for genetic counseling and BRCA1 and BRCA2 testing. Cervical Cancer Your health care provider may recommend that you be screened regularly for cancer of the pelvic organs (ovaries, uterus, and vagina). This screening involves a pelvic examination, including checking for microscopic changes to the surface of your cervix (Pap test). You may be encouraged to have this screening done every 3 years, beginning at age 45.  For women ages 23-65, health care providers may recommend pelvic exams and Pap testing every 3 years, or they may recommend the Pap and pelvic exam, combined with testing for human papilloma virus (HPV), every 5 years. Some types of HPV increase your risk of cervical cancer. Testing for HPV may also be done on women of any age with unclear Pap test results.  Other health care providers may not recommend any screening for nonpregnant women who are considered low risk for pelvic cancer and who do not have symptoms. Ask your health care provider if a screening pelvic exam is right for you.  If you have had past treatment for cervical cancer or a condition that could lead to cancer, you need Pap tests and screening for  cancer for at least 20 years after your treatment. If Pap tests have been discontinued, your risk factors (such as having a new sexual partner) need to be reassessed to determine if screening should resume. Some women have medical problems that increase the chance of getting cervical cancer. In these cases, your health care provider may recommend more frequent screening and Pap tests. Colorectal Cancer  This type of cancer can be detected and often prevented.  Routine colorectal cancer screening usually begins at 78 years of age and continues through 78 years of age.  Your health care provider may recommend screening at an earlier age if you have risk factors for colon cancer.  Your health care provider may also recommend using home test kits to check for hidden blood in the stool.  A small camera at the end of a tube can be used to examine your colon directly (sigmoidoscopy or colonoscopy). This is done to check for the earliest forms  of colorectal cancer.  Routine screening usually begins at age 19.  Direct examination of the colon should be repeated every 5-10 years through 78 years of age. However, you may need to be screened more often if early forms of precancerous polyps or small growths are found. Skin Cancer  Check your skin from head to toe regularly.  Tell your health care provider about any new moles or changes in moles, especially if there is a change in a mole's shape or color.  Also tell your health care provider if you have a mole that is larger than the size of a pencil eraser.  Always use sunscreen. Apply sunscreen liberally and repeatedly throughout the day.  Protect yourself by wearing long sleeves, pants, a wide-brimmed hat, and sunglasses whenever you are outside. HEART DISEASE, DIABETES, AND HIGH BLOOD PRESSURE   High blood pressure causes heart disease and increases the risk of stroke. High blood pressure is more likely to develop in:  People who have blood  pressure in the high end of the normal range (130-139/85-89 mm Hg).  People who are overweight or obese.  People who are African American.  If you are 63-61 years of age, have your blood pressure checked every 3-5 years. If you are 31 years of age or older, have your blood pressure checked every year. You should have your blood pressure measured twice--once when you are at a hospital or clinic, and once when you are not at a hospital or clinic. Record the average of the two measurements. To check your blood pressure when you are not at a hospital or clinic, you can use:  An automated blood pressure machine at a pharmacy.  A home blood pressure monitor.  If you are between 54 years and 65 years old, ask your health care provider if you should take aspirin to prevent strokes.  Have regular diabetes screenings. This involves taking a blood sample to check your fasting blood sugar level.  If you are at a normal weight and have a low risk for diabetes, have this test once every three years after 78 years of age.  If you are overweight and have a high risk for diabetes, consider being tested at a younger age or more often. PREVENTING INFECTION  Hepatitis B  If you have a higher risk for hepatitis B, you should be screened for this virus. You are considered at high risk for hepatitis B if:  You were born in a country where hepatitis B is common. Ask your health care provider which countries are considered high risk.  Your parents were born in a high-risk country, and you have not been immunized against hepatitis B (hepatitis B vaccine).  You have HIV or AIDS.  You use needles to inject street drugs.  You live with someone who has hepatitis B.  You have had sex with someone who has hepatitis B.  You get hemodialysis treatment.  You take certain medicines for conditions, including cancer, organ transplantation, and autoimmune conditions. Hepatitis C  Blood testing is recommended  for:  Everyone born from [birth year] through 1963.  Anyone with known risk factors for hepatitis C. Sexually transmitted infections (STIs)  You should be screened for sexually transmitted infections (STIs) including gonorrhea and chlamydia if:  You are sexually active and are younger than 78 years of age.  You are older than 78 years of age and your health care provider tells you that you are at risk for this type of infection.  Your  sexual activity has changed since you were last screened and you are at an increased risk for chlamydia or gonorrhea. Ask your health care provider if you are at risk.  If you do not have HIV, but are at risk, it may be recommended that you take a prescription medicine daily to prevent HIV infection. This is called pre-exposure prophylaxis (PrEP). You are considered at risk if:  You are sexually active and do not regularly use condoms or know the HIV status of your partner(s).  You take drugs by injection.  You are sexually active with a partner who has HIV. Talk with your health care provider about whether you are at high risk of being infected with HIV. If you choose to begin PrEP, you should first be tested for HIV. You should then be tested every 3 months for as long as you are taking PrEP.  PREGNANCY   If you are premenopausal and you may become pregnant, ask your health care provider about preconception counseling.  If you may become pregnant, take 400 to 800 micrograms (mcg) of folic acid every day.  If you want to prevent pregnancy, talk to your health care provider about birth control (contraception). OSTEOPOROSIS AND MENOPAUSE   Osteoporosis is a disease in which the bones lose minerals and strength with aging. This can result in serious bone fractures. Your risk for osteoporosis can be identified using a bone density scan.  If you are 27 years of age or older, or if you are at risk for osteoporosis and fractures, ask your health care provider if you  should be screened.  Ask your health care provider whether you should take a calcium or vitamin D supplement to lower your risk for osteoporosis.  Menopause may have certain physical symptoms and risks.  Hormone replacement therapy may reduce some of these symptoms and risks. Talk to your health care provider about whether hormone replacement therapy is right for you.  HOME CARE INSTRUCTIONS   Schedule regular health, dental, and eye exams.  Stay current with your immunizations.   Do not use any tobacco products including cigarettes, chewing tobacco, or electronic cigarettes.  If you are pregnant, do not drink alcohol.  If you are breastfeeding, limit how much and how often you drink alcohol.  Limit alcohol intake to no more than 1 drink per day for nonpregnant women. One drink equals 12 ounces of beer, 5 ounces of wine, or 1 ounces of hard liquor.  Do not use street drugs.  Do not share needles.  Ask your health care provider for help if you need support or information about quitting drugs.  Tell your health care provider if you often feel depressed.  Tell your health care provider if you have ever been abused or do not feel safe at home.   This information is not intended to replace advice given to you by your health care provider. Make sure you discuss any questions you have with your health care provider.   Document Released: 05/26/2011 Document Revised: 12/01/2014 Document Reviewed: 10/12/2013 Elsevier Interactive Patient Education [employer].

## 2016-03-27 NOTE — Progress Notes (Signed)
Subjective:    Patient ID: Kelli Nielsen, female    DOB: Oct 20, 1936, 80 y.o.   MRN: [id number]  Chief Complaint  Patient presents with  . CPE    Fasting    HPI:  Kelli Nielsen is a 80 y.o. female who presents today for an annual wellness visit.   1) Health Maintenance -   Diet - Averaging about 1-2 meals per day consisting of fruits, vegetables, chicken, beef, pork; 2-3 cups of caffeine daily  Exercise - Always in motion  2) Preventative Exams / Immunizations:  Dental -- Up to date  Vision -- Up to date   Health Maintenance  Topic Date Due  . ZOSTAVAX  04/03/1998  . PNA vac Low Risk Adult (2 of 2 - PCV13) 09/04/2009  . INFLUENZA VACCINE  06/24/2016  . TETANUS/TDAP  11/20/2021  . DEXA SCAN  Completed  Prevnar   Immunization History  Administered Date(s) Administered  . Influenza Split 09/02/2011, 09/08/2012  . Influenza Whole 10/06/2007, 08/16/2008, 08/21/2009, 09/05/2010  . Influenza, High Dose Seasonal PF 08/17/2015  . Influenza,inj,Quad PF,36+ Mos 08/15/2014  . Pneumococcal Polysaccharide-23 09/04/2008  . Tdap 11/21/2011    RISK FACTORS  Tobacco History  Smoking status  . Never Smoker   Smokeless tobacco  . Never Used     Cardiac risk factors: advanced age (older than 36 for men, 42 for women) and dyslipidemia.  Depression Screen  Q1: Over the past two weeks, have you felt down, depressed or hopeless? No  Q2: Over the past two weeks, have you felt little interest or pleasure in doing things? No  Have you lost interest or pleasure in daily life? No  Do you often feel hopeless? No  Do you cry easily over simple problems? No  Activities of Daily Living In your present state of health, do you have any difficulty performing the following activities?:  Driving? No Managing money?  No Feeding yourself? No Getting from bed to chair? No Climbing a flight of stairs? No Preparing food and eating?: No Bathing or showering? No Getting dressed:  No Getting to the toilet? No Using the toilet: No Moving around from place to place: No In the past year have you fallen or had a near fall?:No   Home Safety Has smoke detector and wears seat belts. No firearms. No excess sun exposure. Are there smokers in your home (other than you)?  No Do you feel safe at home?  Yes  Hearing Difficulties: No Do you often ask people to speak up or repeat themselves? No Do you experience ringing or noises in your ears? No  Do you have difficulty understanding soft or whispered voices? No    Cognitive Testing  Alert? Yes   Normal Appearance? Yes  Oriented to person? Yes  Place? Yes   Time? Yes  Recall of three objects?  Yes  Can perform simple calculations? Yes  Displays appropriate judgment? Yes  Can read the correct time from a watch face? Yes  Do you feel that you have a problem with memory? No  Do you often misplace items? No   Advanced Directives have been discussed with the patient? Yes - living will in place.  Current Physicians/Providers and Suppliers  1. Terri Piedra, FNP - Internal Medicine / PCP  Indicate any recent Medical Services you may have received from other than [facility] providers in the past year (date may be approximate).  All answers were reviewed with the patient and necessary referrals were made:  Mauricio Po, FNP   03/27/2016    Allergies  Allergen Reactions  . Azithromycin     REACTION: elevated liver enzymes  . Penicillins Hives    Hives      Outpatient Prescriptions Prior to Visit  Medication Sig Dispense Refill  . Probiotic Product (ALIGN) 4 MG CAPS Take 1 capsule by mouth daily.      Marland Kitchen ALPRAZolam (XANAX) 0.25 MG tablet TAKE ONE TABLET BY MOUTH AT BEDTIME AS NEEDED FOR ANXIETY--- Must have office visit for further refills. 30 tablet 0  . benzonatate (TESSALON) 100 MG capsule Take 1 capsule (100 mg total) by mouth 3 (three) times daily as needed for cough. (Patient not taking: Reported on 03/13/2016) 20  capsule 0  . metoprolol tartrate (LOPRESSOR) 25 MG tablet Take 1 tablet (25 mg total) by mouth 2 (two) times daily. (Patient not taking: Reported on 03/13/2016) 60 tablet 2  . phenazopyridine (PYRIDIUM) 100 MG tablet Take 1 tablet (100 mg total) by mouth 3 (three) times daily as needed for pain. 8 tablet 0  . sulfamethoxazole-trimethoprim (BACTRIM DS,SEPTRA DS) 800-160 MG tablet Take 1 tablet by mouth 2 (two) times daily. 6 tablet 0   No facility-administered medications prior to visit.     Past Medical History  Diagnosis Date  . Adenomatous colon polyp   . Anxiety      Past Surgical History  Procedure Laterality Date  . Appendectomy  1959    peritonitis  . Tonsillectomy  1959  . Breast biopsy      benign  . Colonoscopy  2014    negative; Dr Deatra Ina     Family History  Problem Relation Age of Onset  . Heart disease Mother     ? MI  . Diabetes Neg Hx   . Cancer Neg Hx   . Stroke Neg Hx      Social History   Social History  . Marital Status: Married    Spouse Name: N/A  . Number of Children: 1  . Years of Education: 13   Occupational History  . Not on file.   Social History Main Topics  . Smoking status: Never Smoker   . Smokeless tobacco: Never Used  . Alcohol Use: 1.2 oz/week    2 Glasses of wine per week  . Drug Use: No  . Sexual Activity: Not on file   Other Topics Concern  . Not on file   Social History Narrative   Fun: Not much fun    Denies abuse and feels safe at home.     Review of Systems  Constitutional: Denies fever, chills, fatigue, or significant weight gain/loss. HENT: Head: Denies headache or neck pain Ears: Denies changes in hearing, ringing in ears, earache, drainage Nose: Denies discharge, stuffiness, itching, nosebleed, sinus pain Throat: Denies sore throat, hoarseness, dry mouth, sores, thrush Eyes: Denies loss/changes in vision, pain, redness, blurry/double vision, flashing lights Cardiovascular: Denies chest pain/discomfort,  tightness, palpitations, shortness of breath with activity, difficulty lying down, swelling, sudden awakening with shortness of breath Respiratory: Denies shortness of breath, cough, sputum production, wheezing Gastrointestinal: Denies dysphasia, heartburn, change in appetite, nausea, change in bowel habits, rectal bleeding, constipation, diarrhea, yellow skin or eyes Genitourinary: Denies frequency, urgency, burning/pain, blood in urine, incontinence, change in urinary strength. Musculoskeletal: Denies muscle/joint pain, stiffness, back pain, redness or swelling of joints, trauma Skin: Denies rashes, lumps, itching, dryness, color changes, or hair/nail changes Neurological: Denies dizziness, fainting, seizures, weakness, numbness, tingling, tremor Psychiatric - Denies nervousness, stress,  depression or memory loss Endocrine: Denies heat or cold intolerance, sweating, frequent urination, excessive thirst, changes in appetite Hematologic: Denies ease of bruising or bleeding    Objective:     BP 154/70 mmHg  Pulse 68  Temp(Src) 97.7 F (36.5 C) (Oral)  Resp 16  Ht 5' 7.5" (1.715 m)  Wt 130 lb 12.8 oz (59.33 kg)  BMI 20.17 kg/m2  SpO2 98% Nursing note and vital signs reviewed..  Physical Exam  Constitutional: She is oriented to person, place, and time. She appears well-developed and well-nourished.  HENT:  Head: Normocephalic.  Right Ear: Hearing, tympanic membrane, external ear and ear canal normal.  Left Ear: Hearing, tympanic membrane, external ear and ear canal normal.  Nose: Nose normal.  Mouth/Throat: Uvula is midline, oropharynx is clear and moist and mucous membranes are normal.  Eyes: Conjunctivae and EOM are normal. Pupils are equal, round, and reactive to light.  Neck: Neck supple. No JVD present. No tracheal deviation present. No thyromegaly present.  Cardiovascular: Normal rate, regular rhythm, normal heart sounds and intact distal pulses.   Pulmonary/Chest: Effort  normal and breath sounds normal.  Abdominal: Soft. Bowel sounds are normal. She exhibits no distension and no mass. There is no tenderness. There is no rebound and no guarding.  Musculoskeletal: Normal range of motion. She exhibits no edema or tenderness.  Lymphadenopathy:    She has no cervical adenopathy.  Neurological: She is alert and oriented to person, place, and time. She has normal reflexes. No cranial nerve deficit. She exhibits normal muscle tone. Coordination normal.  Skin: Skin is warm and dry.  Psychiatric: She has a normal mood and affect. Her behavior is normal. Judgment and thought content normal.       Assessment & Plan:   During the course of the visit the patient was educated and counseled about appropriate screening and preventive services including:    Influenza vaccine  Glaucoma screening  Nutrition counseling   Diet review for nutrition referral? Yes ____  Not Indicated _X___   Patient Instructions (the written plan) was given to the patient.  Medicare Attestation I have personally reviewed: The patient's medical and social history Their use of alcohol, tobacco or illicit drugs Their current medications and supplements The patient's functional ability including ADLs,fall risks, home safety risks, cognitive, and hearing and visual impairment Diet and physical activities Evidence for depression or mood disorders  The patient's weight, height, BMI,  have been recorded in the chart.  I have made referrals, counseling, and provided education to the patient based on review of the above and I have provided the patient with a written personalized care plan for preventive services.     Mauricio Po, FNP   03/27/2016    Problem List Items Addressed This Visit      Other   Routine general medical examination at a health care facility    1) Anticipatory Guidance: Discussed importance of wearing a seatbelt while driving and not texting while driving; changing  batteries in smoke detector at least once annually; wearing suntan lotion when outside; eating a balanced and moderate diet; getting physical activity at least 30 minutes per day.  2) Immunizations / Screenings / Labs:  Prevnar updated today. Declines Zostavax and will research. All other immunizations are up-to-date per recommendations. Obtain vitamin D for vitamin D deficiency screening. All other screenings are up-to-date per recommendations. Obtain CBC, CMET, and Lipid profile.  Overall well exam with risk factors for cardiovascular disease including elevated blood pressure  readings and hyperlipidemia. Currently managed through lifestyle including nutrition and physical activity. She does have anxiety secondary to family stressors with spouse diagnosis of PSP. Continue other healthy lifestyle behaviors and choices. Follow-up prevention exam in 1 year. Follow-up office visit as needed pending blood work.       Relevant Orders   CBC   Comprehensive metabolic panel   Lipid panel   Vitamin D (25 hydroxy)   Medicare annual wellness visit, subsequent - Primary    Reviewed and updated patient's medical, surgical, family and social history. Medications and allergies were also reviewed. Basic screenings for depression, activities of daily living, hearing, cognition and safety were performed. Provider list was updated and health plan was provided to the patient.         I have discontinued Ms. Muffoletto metoprolol tartrate, benzonatate, phenazopyridine, and sulfamethoxazole-trimethoprim. I have also changed her ALPRAZolam. Additionally, I am having her maintain her ALIGN.  Return if symptoms worsen or fail to improve.   Mauricio Po, FNP \

## 2016-03-27 NOTE — Assessment & Plan Note (Signed)
Reviewed and updated patient's medical, surgical, family and social history. Medications and allergies were also reviewed. Basic screenings for depression, activities of daily living, hearing, cognition and safety were performed. Provider list was updated and health plan was provided to the patient.  

## 2016-03-28 NOTE — Telephone Encounter (Signed)
Left message for patient to call back, needs to talk with Kelli Nielsen to give greg's instructions below

## 2016-03-28 NOTE — Telephone Encounter (Signed)
Patient called back, i have advised of greg's note---also gave food suggestions high in vitamin D per patient request---patient will call back closer to 3 weeks to schedule nurse visit for bp check

## 2016-07-17 ENCOUNTER — Ambulatory Visit (INDEPENDENT_AMBULATORY_CARE_PROVIDER_SITE_OTHER): Payer: Medicare Other | Admitting: Family

## 2016-07-17 ENCOUNTER — Encounter: Payer: Self-pay | Admitting: Family

## 2016-07-17 ENCOUNTER — Other Ambulatory Visit: Payer: Medicare Other

## 2016-07-17 VITALS — BP 152/68 | HR 88 | Temp 97.6°F | Resp 16 | Ht 67.5 in | Wt 129.0 lb

## 2016-07-17 DIAGNOSIS — R300 Dysuria: Secondary | ICD-10-CM

## 2016-07-17 LAB — POCT URINALYSIS DIPSTICK
BILIRUBIN UA: NEGATIVE
Glucose, UA: NEGATIVE
Ketones, UA: NEGATIVE
Nitrite, UA: NEGATIVE
PH UA: 6
Protein, UA: NEGATIVE
RBC UA: NEGATIVE
Spec Grav, UA: 1.015
UROBILINOGEN UA: NEGATIVE

## 2016-07-17 MED ORDER — ALPRAZOLAM 0.25 MG PO TABS: ORAL_TABLET | ORAL | 2 refills | Status: DC

## 2016-07-17 MED ORDER — SULFAMETHOXAZOLE-TRIMETHOPRIM 800-160 MG PO TABS: 1.0000 | ORAL_TABLET | Freq: Two times a day (BID) | ORAL | 0 refills | Status: DC

## 2016-07-17 NOTE — Assessment & Plan Note (Signed)
The office urinalysis positive for leukocytes and negative for nitrites and hematuria. Urine will be sent for culture for confirmation. Symptoms are consistent with urinary tract infection. Start Bactrim. Encouraged to drink plenty of fluids and use probiotic as necessary. Follow-up if symptoms worsen or fail to improve.

## 2016-07-17 NOTE — Progress Notes (Signed)
Subjective:    Patient ID: Kelli Nielsen, female    DOB: 1936/03/24, 80 y.o.   MRN: [id number]  Chief Complaint  Patient presents with  . Dysuria    having dysuria since yesterday, has UTIs often    HPI:  Kelli Nielsen is a 80 y.o. female who  has a past medical history of Adenomatous colon polyp and Anxiety. and presents today for an acute office visit.  This is a new problem. Associated symptom of dysuria, urinary frequency and urgency that has been going on for about 1 day. Denies any fevers, flank pain or abdominal pain. Indicates that she does not drink a lot of water.    Allergies  Allergen Reactions  . Azithromycin     REACTION: elevated liver enzymes  . Penicillins Hives    Hives     Outpatient Medications Prior to Visit  Medication Sig Dispense Refill  . Probiotic Product (ALIGN) 4 MG CAPS Take 1 capsule by mouth daily.      Marland Kitchen ALPRAZolam (XANAX) 0.25 MG tablet TAKE ONE TABLET BY MOUTH AT BEDTIME AS NEEDED FOR ANXIETY 30 tablet 2   No facility-administered medications prior to visit.     Review of Systems  Constitutional: Negative for chills and fever.  Genitourinary: Positive for dysuria, frequency and urgency. Negative for flank pain and hematuria.      Objective:    BP (!) 152/68 (BP Location: Left Arm, Patient Position: Sitting, Cuff Size: Normal)   Pulse 88   Temp 97.6 F (36.4 C) (Oral)   Resp 16   Ht 5' 7.5" (1.715 m)   Wt 129 lb (58.5 kg)   SpO2 97%   BMI 19.91 kg/m  Nursing note and vital signs reviewed.  Physical Exam  Constitutional: She is oriented to person, place, and time. She appears well-developed and well-nourished. No distress.  Cardiovascular: Normal rate, regular rhythm, normal heart sounds and intact distal pulses.   Pulmonary/Chest: Effort normal and breath sounds normal.  Abdominal: There is no CVA tenderness.  Neurological: She is alert and oriented to person, place, and time.  Skin: Skin is warm and dry.  Psychiatric: She  has a normal mood and affect. Her behavior is normal. Judgment and thought content normal.       Assessment & Plan:   Problem List Items Addressed This Visit      Other   Dysuria - Primary    The office urinalysis positive for leukocytes and negative for nitrites and hematuria. Urine will be sent for culture for confirmation. Symptoms are consistent with urinary tract infection. Start Bactrim. Encouraged to drink plenty of fluids and use probiotic as necessary. Follow-up if symptoms worsen or fail to improve.      Relevant Medications   sulfamethoxazole-trimethoprim (BACTRIM DS,SEPTRA DS) 800-160 MG tablet   Other Relevant Orders   POCT urinalysis dipstick (Completed)   Urine culture    Other Visit Diagnoses   None.     I am having Ms. Toothman start on sulfamethoxazole-trimethoprim. I am also having her maintain her ALIGN and ALPRAZolam.   Meds ordered this encounter  Medications  . sulfamethoxazole-trimethoprim (BACTRIM DS,SEPTRA DS) 800-160 MG tablet    Sig: Take 1 tablet by mouth 2 (two) times daily.    Dispense:  6 tablet    Refill:  0    Order Specific Question:   Supervising Provider    Answer:   Pricilla Holm A [id number]  . ALPRAZolam (XANAX) 0.25 MG tablet  Sig: TAKE ONE TABLET BY MOUTH AT BEDTIME AS NEEDED FOR ANXIETY    Dispense:  30 tablet    Refill:  2    Order Specific Question:   Supervising Provider    Answer:   Pricilla Holm A [id number]     Follow-up: Return if symptoms worsen or fail to improve.  Mauricio Po, FNP

## 2016-07-17 NOTE — Patient Instructions (Signed)
Thank you for choosing [facility].  SUMMARY AND INSTRUCTIONS:  Medication:  Your prescription(s) have been submitted to your pharmacy or been printed and provided for you. Please take as directed and contact our office if you believe you are having problem(s) with the medication(s) or have any questions.   Follow up:  If your symptoms worsen or fail to improve, please contact our office for further instruction, or in case of emergency go directly to the emergency room at the closest medical facility.    Urinary Tract Infection Urinary tract infections (UTIs) can develop anywhere along your urinary tract. Your urinary tract is your body's drainage system for removing wastes and extra water. Your urinary tract includes two kidneys, two ureters, a bladder, and a urethra. Your kidneys are a pair of bean-shaped organs. Each kidney is about the size of your fist. They are located below your ribs, one on each side of your spine. CAUSES Infections are caused by microbes, which are microscopic organisms, including fungi, viruses, and bacteria. These organisms are so small that they can only be seen through a microscope. Bacteria are the microbes that most commonly cause UTIs. SYMPTOMS  Symptoms of UTIs may vary by age and gender of the patient and by the location of the infection. Symptoms in young women typically include a frequent and intense urge to urinate and a painful, burning feeling in the bladder or urethra during urination. Older women and men are more likely to be tired, shaky, and weak and have muscle aches and abdominal pain. A fever may mean the infection is in your kidneys. Other symptoms of a kidney infection include pain in your back or sides below the ribs, nausea, and vomiting. DIAGNOSIS To diagnose a UTI, your caregiver will ask you about your symptoms. Your caregiver will also ask you to provide a urine sample. The urine sample will be tested for bacteria and white blood  cells. White blood cells are made by your body to help fight infection. TREATMENT  Typically, UTIs can be treated with medication. Because most UTIs are caused by a bacterial infection, they usually can be treated with the use of antibiotics. The choice of antibiotic and length of treatment depend on your symptoms and the type of bacteria causing your infection. HOME CARE INSTRUCTIONS  If you were prescribed antibiotics, take them exactly as your caregiver instructs you. Finish the medication even if you feel better after you have only taken some of the medication.  Drink enough water and fluids to keep your urine clear or pale yellow.  Avoid caffeine, tea, and carbonated beverages. They tend to irritate your bladder.  Empty your bladder often. Avoid holding urine for long periods of time.  Empty your bladder before and after sexual intercourse.  After a bowel movement, women should cleanse from front to back. Use each tissue only once. SEEK MEDICAL CARE IF:   You have back pain.  You develop a fever.  Your symptoms do not begin to resolve within 3 days. SEEK IMMEDIATE MEDICAL CARE IF:   You have severe back pain or lower abdominal pain.  You develop chills.  You have nausea or vomiting.  You have continued burning or discomfort with urination. MAKE SURE YOU:   Understand these instructions.  Will watch your condition.  Will get help right away if you are not doing well or get worse.   This information is not intended to replace advice given to you by your health care provider. Make sure you  discuss any questions you have with your health care provider.   Document Released: 08/20/2005 Document Revised: 08/01/2015 Document Reviewed: 12/19/2011 Elsevier Interactive Patient Education [employer].

## 2016-07-19 LAB — URINE CULTURE

## 2016-08-12 ENCOUNTER — Ambulatory Visit (INDEPENDENT_AMBULATORY_CARE_PROVIDER_SITE_OTHER): Payer: Medicare Other

## 2016-08-12 DIAGNOSIS — Z23 Encounter for immunization: Secondary | ICD-10-CM | POA: Diagnosis not present

## 2016-10-29 DIAGNOSIS — H524 Presbyopia: Secondary | ICD-10-CM | POA: Diagnosis not present

## 2016-11-03 ENCOUNTER — Telehealth: Payer: Self-pay | Admitting: Family

## 2016-11-03 NOTE — Telephone Encounter (Signed)
Patient Name: Kelli Nielsen  DOB: 1935-12-24    Initial Comment sore throat, red, did have the flu shot   Nurse Assessment  Nurse: Leilani Merl, RN, Heather Date/Time (Eastern Time): 11/03/2016 10:11:29 AM  Confirm and document reason for call. If symptomatic, describe symptoms. ---Caller states that she started with a sore throat last night with a slight temperature  Does the patient have any new or worsening symptoms? ---Yes  Will a triage be completed? ---Yes  Related visit to physician within the last 2 weeks? ---No  Does the PT have any chronic conditions? (i.e. diabetes, asthma, etc.) ---Yes  List chronic conditions. ---See MR  Is this a behavioral health or substance abuse call? ---No     Guidelines    Guideline Title Affirmed Question Affirmed Notes  Sore Throat [1] Sore throat is the only symptom AND [2] sore throat present < 48 hours (all triage questions negative)    Final Disposition User   [place], RN, Water quality scientist    Disagree/Comply: Comply

## 2016-11-12 ENCOUNTER — Encounter: Payer: Self-pay | Admitting: Nurse Practitioner

## 2016-11-12 ENCOUNTER — Ambulatory Visit (INDEPENDENT_AMBULATORY_CARE_PROVIDER_SITE_OTHER): Payer: Medicare Other | Admitting: Nurse Practitioner

## 2016-11-12 VITALS — BP 152/66 | HR 90 | Temp 98.1°F | Ht 67.5 in | Wt 127.0 lb

## 2016-11-12 DIAGNOSIS — R0982 Postnasal drip: Secondary | ICD-10-CM

## 2016-11-12 DIAGNOSIS — J060 Acute laryngopharyngitis: Secondary | ICD-10-CM

## 2016-11-12 DIAGNOSIS — L03115 Cellulitis of right lower limb: Secondary | ICD-10-CM

## 2016-11-12 LAB — POCT RAPID STREP A (OFFICE): Rapid Strep A Screen: NEGATIVE

## 2016-11-12 MED ORDER — IPRATROPIUM BROMIDE 0.03 % NA SOLN: 2.0000 | Freq: Two times a day (BID) | NASAL | 0 refills | Status: DC

## 2016-11-12 MED ORDER — DOXYCYCLINE HYCLATE 100 MG PO TABS: 100.0000 mg | ORAL_TABLET | Freq: Two times a day (BID) | ORAL | 0 refills | Status: AC

## 2016-11-12 MED ORDER — BENZONATATE 100 MG PO CAPS: 100.0000 mg | ORAL_CAPSULE | Freq: Three times a day (TID) | ORAL | 0 refills | Status: DC | PRN

## 2016-11-12 NOTE — Patient Instructions (Signed)
Laryngitis Introduction Laryngitis is swelling (inflammation) of your vocal cords. This causes hoarseness, coughing, loss of voice, sore throat, or a dry throat. When your vocal cords are inflamed, your voice sounds different. Laryngitis can be temporary (acute) or long-term (chronic). Most cases of acute laryngitis improve with time. Chronic laryngitis is laryngitis that lasts for more than three weeks. Follow these instructions at home:  Drink enough fluid to keep your pee (urine) clear or pale yellow.  Breathe in moist air. Use a humidifier if you live in a dry climate.  Take medicines only as told by your doctor.  Do not smoke cigarettes or electronic cigarettes. If you need help quitting, ask your doctor.  Talk as little as possible. Also avoid whispering, which can cause vocal strain.  Write instead of talking. Do this until your voice is back to normal. Contact a doctor if:  You have a fever.  Your pain is worse.  You have trouble swallowing. Get help right away if:  You cough up blood.  You have trouble breathing. This information is not intended to replace advice given to you by your health care provider. Make sure you discuss any questions you have with your health care provider. Document Released: 10/30/2011 Document Revised: 04/17/2016 Document Reviewed: 04/25/2014  2017 Elsevier

## 2016-11-12 NOTE — Progress Notes (Signed)
Subjective:  Patient ID: Kelli Nielsen, female    DOB: 02-06-1936  Age: 80 y.o. MRN: [id number]  CC: Laryngitis (started off with cold,sore throat, now loss of voice for 1 wk. took OTC. )   URI   This is a new problem. The current episode started in the past 7 days. The problem has been gradually worsening. Associated symptoms include congestion, coughing, a plugged ear sensation, rhinorrhea, sinus pain, a sore throat and swollen glands. Pertinent negatives include no abdominal pain, chest pain, diarrhea, dysuria, ear pain, headaches, joint pain, joint swelling, nausea, neck pain, rash, sneezing, vomiting or wheezing. She has tried acetaminophen, increased fluids and NSAIDs for the symptoms. The treatment provided no relief.  Wound Check  She was originally treated 5 to 10 days ago (caused by branch in her yard). Previous treatment included wound cleansing or irrigation. Her temperature was unmeasured prior to arrival. There has been clear discharge from the wound. There is new redness present. There is new swelling present. The pain has not changed. She has no difficulty moving the affected extremity or digit.  last tetanus 10/2011.  Outpatient Medications Prior to Visit  Medication Sig Dispense Refill  . ALPRAZolam (XANAX) 0.25 MG tablet TAKE ONE TABLET BY MOUTH AT BEDTIME AS NEEDED FOR ANXIETY 30 tablet 2  . Probiotic Product (ALIGN) 4 MG CAPS Take 1 capsule by mouth daily.      Marland Kitchen sulfamethoxazole-trimethoprim (BACTRIM DS,SEPTRA DS) 800-160 MG tablet Take 1 tablet by mouth 2 (two) times daily. (Patient not taking: Reported on 11/12/2016) 6 tablet 0   No facility-administered medications prior to visit.     ROS See HPI  Objective:  BP (!) 152/66   Pulse 90   Temp 98.1 F (36.7 C)   Ht 5' 7.5" (1.715 m)   Wt 127 lb (57.6 kg)   SpO2 96%   BMI 19.60 kg/m   BP Readings from Last 3 Encounters:  11/12/16 (!) 152/66  07/17/16 (!) 152/68  03/27/16 (!) 154/70    Wt Readings from  Last 3 Encounters:  11/12/16 127 lb (57.6 kg)  07/17/16 129 lb (58.5 kg)  03/27/16 130 lb 12.8 oz (59.3 kg)    Physical Exam  Constitutional: She is oriented to person, place, and time.  HENT:  Right Ear: Tympanic membrane, external ear and ear canal normal.  Left Ear: Tympanic membrane, external ear and ear canal normal.  Nose: Mucosal edema and rhinorrhea present. Right sinus exhibits maxillary sinus tenderness. Right sinus exhibits no frontal sinus tenderness. Left sinus exhibits maxillary sinus tenderness. Left sinus exhibits no frontal sinus tenderness.  Mouth/Throat: Uvula is midline. No trismus in the jaw. Posterior oropharyngeal erythema present. No oropharyngeal exudate.  Eyes: No scleral icterus.  Neck: Normal range of motion. Neck supple.  Cardiovascular: Normal rate and normal heart sounds.   Pulmonary/Chest: Effort normal and breath sounds normal.  Musculoskeletal: She exhibits no edema.       Right knee: Normal.       Right ankle: Normal.       Right lower leg: She exhibits laceration. She exhibits no bony tenderness, no edema and no deformity.       Right foot: Normal.  Lymphadenopathy:    She has cervical adenopathy.  Neurological: She is alert and oriented to person, place, and time.  Skin: Skin is warm and dry. There is erythema.     Vitals reviewed.   Lab Results  Component Value Date   WBC 4.1 03/27/2016  HGB 13.9 03/27/2016   HCT 40.6 03/27/2016   PLT 230.0 03/27/2016   GLUCOSE 99 03/27/2016   CHOL 178 03/27/2016   TRIG 54.0 03/27/2016   HDL 86.10 03/27/2016   LDLCALC 81 03/27/2016   ALT 15 03/27/2016   AST 22 03/27/2016   NA 139 03/27/2016   K 3.8 03/27/2016   CL 103 03/27/2016   CREATININE 0.62 03/27/2016   BUN 16 03/27/2016   CO2 27 03/27/2016   TSH 1.31 05/05/2014   HGBA1C 5.4 05/05/2014    Dg Chest 2 View  Result Date: 11/07/2014 CLINICAL DATA:  Productive cough and fever for 3 days; no history of tobacco use EXAM: CHEST  2 VIEW  COMPARISON:  PA and lateral chest of May 10, 2013 FINDINGS: The lungs are mildly hyperinflated. There are increased infrahilar lung markings bilaterally predominantly anteriorly there is no alveolar infiltrate. There is no pleural effusion or pneumothorax. The heart and pulmonary vascularity are within the limits of normal. There is mild tortuosity of the descending thoracic aorta. The bony thorax is unremarkable. IMPRESSION: Mild hyperinflation likely reflects underlying reactive airway disease. Increased infrahilar lung markings are consistent with acute bronchitic change or minimal subsegmental atelectasis. Follow-up films following anticipated antibiotic therapy are recommended to assure clearing. Electronically Signed   By: David  Martinique   On: 11/07/2014 13:46    Assessment & Plan:   Kelli Nielsen was seen today for laryngitis.  Diagnoses and all orders for this visit:  Acute laryngopharyngitis -     POCT rapid strep A -     doxycycline (VIBRA-TABS) 100 MG tablet; Take 1 tablet (100 mg total) by mouth 2 (two) times daily. -     ipratropium (ATROVENT) 0.03 % nasal spray; Place 2 sprays into both nostrils 2 (two) times daily. Do not use for more than 5days. -     benzonatate (TESSALON) 100 MG capsule; Take 1 capsule (100 mg total) by mouth 3 (three) times daily as needed for cough.  Postnasal drip -     ipratropium (ATROVENT) 0.03 % nasal spray; Place 2 sprays into both nostrils 2 (two) times daily. Do not use for more than 5days.  Cellulitis of right lower extremity -     doxycycline (VIBRA-TABS) 100 MG tablet; Take 1 tablet (100 mg total) by mouth 2 (two) times daily.   I have discontinued Kelli Nielsen sulfamethoxazole-trimethoprim. I am also having her start on doxycycline, ipratropium, and benzonatate. Additionally, I am having her maintain her ALIGN and ALPRAZolam.  Meds ordered this encounter  Medications  . doxycycline (VIBRA-TABS) 100 MG tablet    Sig: Take 1 tablet (100 mg total) by  mouth 2 (two) times daily.    Dispense:  14 tablet    Refill:  0    Order Specific Question:   Supervising Provider    Answer:   Kelli Nielsen [[id number]]  . ipratropium (ATROVENT) 0.03 % nasal spray    Sig: Place 2 sprays into both nostrils 2 (two) times daily. Do not use for more than 5days.    Dispense:  30 mL    Refill:  0    Order Specific Question:   Supervising Provider    Answer:   Kelli Nielsen [[id number]]  . benzonatate (TESSALON) 100 MG capsule    Sig: Take 1 capsule (100 mg total) by mouth 3 (three) times daily as needed for cough.    Dispense:  20 capsule    Refill:  0    Order Specific Question:  Supervising Provider    Answer:   Kelli Nielsen [[id number]]    Follow-up: Return if symptoms worsen or fail to improve.  Wilfred Lacy, NP

## 2016-11-12 NOTE — Progress Notes (Signed)
Reviewed with patient in office. See office note

## 2016-11-12 NOTE — Progress Notes (Signed)
Pre visit review using our clinic review tool, if applicable. No additional management support is needed unless otherwise documented below in the visit note. 

## 2016-11-20 ENCOUNTER — Encounter: Payer: Self-pay | Admitting: Nurse Practitioner

## 2016-11-20 ENCOUNTER — Ambulatory Visit (INDEPENDENT_AMBULATORY_CARE_PROVIDER_SITE_OTHER): Payer: Medicare Other | Admitting: Nurse Practitioner

## 2016-11-20 VITALS — BP 180/80 | HR 80 | Temp 98.1°F | Ht 67.5 in | Wt 127.5 lb

## 2016-11-20 DIAGNOSIS — T148XXA Other injury of unspecified body region, initial encounter: Secondary | ICD-10-CM | POA: Diagnosis not present

## 2016-11-20 DIAGNOSIS — L03115 Cellulitis of right lower limb: Secondary | ICD-10-CM

## 2016-11-20 DIAGNOSIS — J309 Allergic rhinitis, unspecified: Secondary | ICD-10-CM | POA: Diagnosis not present

## 2016-11-20 DIAGNOSIS — L24A9 Irritant contact dermatitis due friction or contact with other specified body fluids: Secondary | ICD-10-CM

## 2016-11-20 MED ORDER — FLUTICASONE PROPIONATE 50 MCG/ACT NA SUSP: 2.0000 | Freq: Every day | NASAL | 0 refills | Status: DC

## 2016-11-20 MED ORDER — DOXYCYCLINE HYCLATE 100 MG PO TABS: 100.0000 mg | ORAL_TABLET | Freq: Two times a day (BID) | ORAL | 0 refills | Status: AC

## 2016-11-20 MED ORDER — CETIRIZINE HCL 10 MG PO TABS: 10.0000 mg | ORAL_TABLET | Freq: Every day | ORAL | 0 refills | Status: DC

## 2016-11-20 NOTE — Progress Notes (Signed)
Subjective:  Patient ID: Kelli Nielsen, female    DOB: May 19, 1936  Age: 80 y.o. MRN: [id number]  CC: Wound Check (Right anterior lower extremity wound. Pt here to have it rechecked. )   HPI  Right leg wound: Present with persistent right lower peg wound. Associated with serous drainage and tenderness. She has complete oral abx as prescribed with some improvement. Denies any fever, no numbness or tingling. Has trace edema with prolonged standing or walking (chronic per patient).  Outpatient Medications Prior to Visit  Medication Sig Dispense Refill  . ALPRAZolam (XANAX) 0.25 MG tablet TAKE ONE TABLET BY MOUTH AT BEDTIME AS NEEDED FOR ANXIETY 30 tablet 2  . Probiotic Product (ALIGN) 4 MG CAPS Take 1 capsule by mouth daily.      . benzonatate (TESSALON) 100 MG capsule Take 1 capsule (100 mg total) by mouth 3 (three) times daily as needed for cough. 20 capsule 0  . ipratropium (ATROVENT) 0.03 % nasal spray Place 2 sprays into both nostrils 2 (two) times daily. Do not use for more than 5days. 30 mL 0   No facility-administered medications prior to visit.     ROS See HPI  Objective:  BP (!) 180/80 (BP Location: Left Arm, Patient Position: Sitting, Cuff Size: Normal)   Pulse 80   Temp 98.1 F (36.7 C) (Oral)   Ht 5' 7.5" (1.715 m)   Wt 127 lb 8 oz (57.8 kg)   SpO2 98%   BMI 19.67 kg/m   BP Readings from Last 3 Encounters:  11/20/16 (!) 180/80  11/12/16 (!) 152/66  07/17/16 (!) 152/68    Wt Readings from Last 3 Encounters:  11/20/16 127 lb 8 oz (57.8 kg)  11/12/16 127 lb (57.6 kg)  07/17/16 129 lb (58.5 kg)    Physical Exam  Constitutional: She is oriented to person, place, and time.  Cardiovascular: Normal rate.   Pulmonary/Chest: Effort normal.  Musculoskeletal: She exhibits edema and tenderness.  Bilateral LE edema (non pitting)  Neurological: She is alert and oriented to person, place, and time.  Skin: Skin is warm and dry. There is erythema.       Lab  Results  Component Value Date   WBC 4.1 03/27/2016   HGB 13.9 03/27/2016   HCT 40.6 03/27/2016   PLT 230.0 03/27/2016   GLUCOSE 99 03/27/2016   CHOL 178 03/27/2016   TRIG 54.0 03/27/2016   HDL 86.10 03/27/2016   LDLCALC 81 03/27/2016   ALT 15 03/27/2016   AST 22 03/27/2016   NA 139 03/27/2016   K 3.8 03/27/2016   CL 103 03/27/2016   CREATININE 0.62 03/27/2016   BUN 16 03/27/2016   CO2 27 03/27/2016   TSH 1.31 05/05/2014   HGBA1C 5.4 05/05/2014    Dg Chest 2 View  Result Date: 11/07/2014 CLINICAL DATA:  Productive cough and fever for 3 days; no history of tobacco use EXAM: CHEST  2 VIEW COMPARISON:  PA and lateral chest of May 10, 2013 FINDINGS: The lungs are mildly hyperinflated. There are increased infrahilar lung markings bilaterally predominantly anteriorly there is no alveolar infiltrate. There is no pleural effusion or pneumothorax. The heart and pulmonary vascularity are within the limits of normal. There is mild tortuosity of the descending thoracic aorta. The bony thorax is unremarkable. IMPRESSION: Mild hyperinflation likely reflects underlying reactive airway disease. Increased infrahilar lung markings are consistent with acute bronchitic change or minimal subsegmental atelectasis. Follow-up films following anticipated antibiotic therapy are recommended to assure clearing. Electronically  Signed   By: David  Martinique   On: 11/07/2014 13:46    Assessment & Plan:   Kelli Nielsen was seen today for wound check.  Diagnoses and all orders for this visit:  Cellulitis of right lower extremity -     doxycycline (VIBRA-TABS) 100 MG tablet; Take 1 tablet (100 mg total) by mouth 2 (two) times daily.  Wound drainage -     doxycycline (VIBRA-TABS) 100 MG tablet; Take 1 tablet (100 mg total) by mouth 2 (two) times daily.  Acute allergic rhinitis, unspecified seasonality, unspecified trigger -     fluticasone (FLONASE) 50 MCG/ACT nasal spray; Place 2 sprays into both nostrils daily. -      cetirizine (ZYRTEC) 10 MG tablet; Take 1 tablet (10 mg total) by mouth daily.   I have discontinued Ms. Doh ipratropium and benzonatate. I am also having her start on doxycycline, fluticasone, and cetirizine. Additionally, I am having her maintain her ALIGN and ALPRAZolam.  Meds ordered this encounter  Medications  . doxycycline (VIBRA-TABS) 100 MG tablet    Sig: Take 1 tablet (100 mg total) by mouth 2 (two) times daily.    Dispense:  14 tablet    Refill:  0    Order Specific Question:   Supervising Provider    Answer:   Cassandria Anger [[id number]]  . fluticasone (FLONASE) 50 MCG/ACT nasal spray    Sig: Place 2 sprays into both nostrils daily.    Dispense:  16 g    Refill:  0    Order Specific Question:   Supervising Provider    Answer:   Cassandria Anger [[id number]]  . cetirizine (ZYRTEC) 10 MG tablet    Sig: Take 1 tablet (10 mg total) by mouth daily.    Dispense:  30 tablet    Refill:  0    Order Specific Question:   Supervising Provider    Answer:   Cassandria Anger [[id number]]    Follow-up: No Follow-up on file.  Wilfred Lacy, NP

## 2016-11-20 NOTE — Progress Notes (Signed)
Pre visit review using our clinic review tool, if applicable. No additional management support is needed unless otherwise documented below in the visit note. 

## 2016-11-21 NOTE — Patient Instructions (Signed)
Leave wound open as much as possible.

## 2017-05-01 ENCOUNTER — Ambulatory Visit (INDEPENDENT_AMBULATORY_CARE_PROVIDER_SITE_OTHER): Payer: Medicare Other | Admitting: Family

## 2017-05-01 ENCOUNTER — Other Ambulatory Visit (INDEPENDENT_AMBULATORY_CARE_PROVIDER_SITE_OTHER): Payer: Medicare Other

## 2017-05-01 ENCOUNTER — Encounter: Payer: Self-pay | Admitting: Family

## 2017-05-01 VITALS — BP 148/70 | HR 83 | Temp 98.3°F | Resp 16 | Ht 67.5 in | Wt 124.4 lb

## 2017-05-01 DIAGNOSIS — E782 Mixed hyperlipidemia: Secondary | ICD-10-CM

## 2017-05-01 DIAGNOSIS — Z0000 Encounter for general adult medical examination without abnormal findings: Secondary | ICD-10-CM

## 2017-05-01 LAB — COMPREHENSIVE METABOLIC PANEL
ALBUMIN: 4.3 g/dL (ref 3.5–5.2)
ALT: 17 U/L (ref 0–35)
AST: 24 U/L (ref 0–37)
Alkaline Phosphatase: 40 U/L (ref 39–117)
BUN: 20 mg/dL (ref 6–23)
CHLORIDE: 105 meq/L (ref 96–112)
CO2: 29 meq/L (ref 19–32)
Calcium: 9.4 mg/dL (ref 8.4–10.5)
Creatinine, Ser: 0.64 mg/dL (ref 0.40–1.20)
GFR: 95.12 mL/min (ref >60.00)
GLUCOSE: 97 mg/dL (ref 70–99)
POTASSIUM: 4 meq/L (ref 3.5–5.1)
SODIUM: 140 meq/L (ref 135–145)
Total Bilirubin: 0.9 mg/dL (ref 0.2–1.2)
Total Protein: 6.9 g/dL (ref 6.0–8.3)

## 2017-05-01 LAB — LIPID PANEL
CHOLESTEROL: 156 mg/dL (ref 0–200)
HDL: 83.2 mg/dL (ref >39.00)
LDL CALC: 63 mg/dL (ref 0–99)
NonHDL: 72.94
Total CHOL/HDL Ratio: 2
Triglycerides: 50 mg/dL (ref 0.0–149.0)
VLDL: 10 mg/dL (ref 0.0–40.0)

## 2017-05-01 LAB — CBC
HEMATOCRIT: 41.2 % (ref 36.0–46.0)
Hemoglobin: 13.8 g/dL (ref 12.0–15.0)
MCHC: 33.6 g/dL (ref 30.0–36.0)
MCV: 101.5 fl — ABNORMAL HIGH (ref 78.0–100.0)
Platelets: 220 10*3/uL (ref 150.0–400.0)
RBC: 4.06 Mil/uL (ref 3.87–5.11)
RDW: 12.9 % (ref 11.5–15.5)
WBC: 3.5 10*3/uL — ABNORMAL LOW (ref 4.0–10.5)

## 2017-05-01 MED ORDER — ALPRAZOLAM 0.25 MG PO TABS: ORAL_TABLET | ORAL | 2 refills | Status: DC

## 2017-05-01 NOTE — Progress Notes (Signed)
Subjective:    Patient ID: Kelli Nielsen, female    DOB: 1936-01-18, 81 y.o.   MRN: [id number]  Chief Complaint  Patient presents with  . CPE    fasting    HPI:  Kelli Nielsen is a 81 y.o. female who presents today for a Medicare Annual Wellness/Physical exam.    1) Health Maintenance -   Diet - Averaging about 2-3 meals per day consisting of a regular diet; Caffeine intake of about 2-3 cups per day  Exercise - Walks at work and is in constant motion.   Wt Readings from Last 3 Encounters:  05/01/17 124 lb 6.4 oz (56.4 kg)  11/20/16 127 lb 8 oz (57.8 kg)  11/12/16 127 lb (57.6 kg)    2) Preventative Exams / Immunizations:  Dental -- Up to date  Vision -- Up to date   Health Maintenance  Topic Date Due  . INFLUENZA VACCINE  06/24/2017  . TETANUS/TDAP  11/20/2021  . DEXA SCAN  Completed  . PNA vac Low Risk Adult  Completed     Immunization History  Administered Date(s) Administered  . Influenza Split 09/02/2011, 09/08/2012  . Influenza Whole 10/06/2007, 08/16/2008, 08/21/2009, 09/05/2010  . Influenza, High Dose Seasonal PF 08/17/2015, 08/12/2016  . Influenza,inj,Quad PF,36+ Mos 08/15/2014  . Pneumococcal Conjugate-13 03/27/2016  . Pneumococcal Polysaccharide-23 09/04/2008  . Tdap 11/21/2011    RISK FACTORS  Tobacco History  Smoking Status  . Never Smoker  Smokeless Tobacco  . Never Used     Cardiac risk factors: advanced age (older than 8 for men, 41 for women) and dyslipidemia.  Depression Screen  Depression screen [facility] 2/9 05/01/2017  Decreased Interest 0  Down, Depressed, Hopeless 0  PHQ - 2 Score 0  Altered sleeping -  Tired, decreased energy -  Change in appetite -  Feeling bad or failure about yourself  -  Trouble concentrating -  Moving slowly or fidgety/restless -  Suicidal thoughts -  PHQ-9 Score -  Difficult doing work/chores -     Activities of Daily Living In your present state of health, do you have any difficulty performing  the following activities?:  Driving? No Managing money?  No Feeding yourself? No Getting from bed to chair? No Climbing a flight of stairs? No Preparing food and eating?: No Bathing or showering? No Getting dressed: No Getting to the toilet? No Using the toilet: No Moving around from place to place: No In the past year have you fallen or had a near fall?:No   Home Safety Has smoke detector and wears seat belts. No firearms. No excess sun exposure. Are there smokers in your home (other than you)?  No Do you feel safe at home?  Yes  Hearing Difficulties: No Do you often ask people to speak up or repeat themselves? No Do you experience ringing or noises in your ears? No  Do you have difficulty understanding soft or whispered voices? No    Cognitive Testing  Alert? Yes   Normal Appearance? Yes  Oriented to person? Yes  Place? Yes   Time? Yes  Recall of three objects?  Yes  Can perform simple calculations? Yes  Displays appropriate judgment? Yes  Can read the correct time from a watch face? Yes  Do you feel that you have a problem with memory? No  Do you often misplace items? No   Advanced Directives have been discussed with the patient? Yes   Current Physicians/Providers and Suppliers  1. Terri Piedra,  FNP - Internal Medicine  Indicate any recent Medical Services you may have received from other than [facility] providers in the past year (date may be approximate).  All answers were reviewed with the patient and necessary referrals were made:  Mauricio Po, [place]   05/01/2017    Allergies  Allergen Reactions  . Azithromycin     REACTION: elevated liver enzymes  . Penicillins Hives    Hives      Outpatient Medications Prior to Visit  Medication Sig Dispense Refill  . Probiotic Product (ALIGN) 4 MG CAPS Take 1 capsule by mouth daily.      Marland Kitchen ALPRAZolam (XANAX) 0.25 MG tablet TAKE ONE TABLET BY MOUTH AT BEDTIME AS NEEDED FOR ANXIETY 30 tablet 2  . cetirizine (ZYRTEC) 10  MG tablet Take 1 tablet (10 mg total) by mouth daily. 30 tablet 0  . fluticasone (FLONASE) 50 MCG/ACT nasal spray Place 2 sprays into both nostrils daily. 16 g 0   No facility-administered medications prior to visit.      Past Medical History:  Diagnosis Date  . Adenomatous colon polyp   . Anxiety      Past Surgical History:  Procedure Laterality Date  . APPENDECTOMY  1959   peritonitis  . BREAST BIOPSY     benign  . COLONOSCOPY  2014   negative; Dr Deatra Ina  . TONSILLECTOMY  1959     Family History  Problem Relation Age of Onset  . Heart disease Mother        ? MI  . Diabetes Neg Hx   . Cancer Neg Hx   . Stroke Neg Hx      Social History   Social History  . Marital status: Married    Spouse name: N/A  . Number of children: 1  . Years of education: 71   Occupational History  . Not on file.   Social History Main Topics  . Smoking status: Never Smoker  . Smokeless tobacco: Never Used  . Alcohol use 1.2 oz/week    2 Glasses of wine per week  . Drug use: No  . Sexual activity: Not on file   Other Topics Concern  . Not on file   Social History Narrative   Fun: Not much fun    Denies abuse and feels safe at home.      Review of Systems  Constitutional: Denies fever, chills, fatigue, or significant weight gain/loss. HENT: Head: Denies headache or neck pain Ears: Denies changes in hearing, ringing in ears, earache, drainage Nose: Denies discharge, stuffiness, itching, nosebleed, sinus pain Throat: Denies sore throat, hoarseness, dry mouth, sores, thrush Eyes: Denies loss/changes in vision, pain, redness, blurry/double vision, flashing lights Cardiovascular: Denies chest pain/discomfort, tightness, palpitations, shortness of breath with activity, difficulty lying down, swelling, sudden awakening with shortness of breath Respiratory: Denies shortness of breath, cough, sputum production, wheezing Gastrointestinal: Denies dysphasia, heartburn, change in  appetite, nausea, change in bowel habits, rectal bleeding, constipation, diarrhea, yellow skin or eyes Genitourinary: Denies frequency, urgency, burning/pain, blood in urine, incontinence, change in urinary strength. Musculoskeletal: Denies muscle/joint pain, stiffness, back pain, redness or swelling of joints, trauma Skin: Denies rashes, lumps, itching, dryness, color changes, or hair/nail changes Neurological: Denies dizziness, fainting, seizures, weakness, numbness, tingling, tremor Psychiatric - Denies nervousness, stress, depression or memory loss Endocrine: Denies heat or cold intolerance, sweating, frequent urination, excessive thirst, changes in appetite Hematologic: Denies ease of bruising or bleeding    Objective:     BP (!) 148/70 (  BP Location: Left Arm, Patient Position: Sitting, Cuff Size: Normal)   Pulse 83   Temp 98.3 F (36.8 C) (Oral)   Resp 16   Ht 5' 7.5" (1.715 m)   Wt 124 lb 6.4 oz (56.4 kg)   SpO2 97%   BMI 19.20 kg/m  Nursing note and vital signs reviewed.  Physical Exam  Constitutional: She is oriented to person, place, and time. She appears well-developed. She appears cachectic.  Non-toxic appearance. She does not have a sickly appearance. She does not appear ill. No distress.  HENT:  Head: Normocephalic.  Right Ear: Hearing, tympanic membrane, external ear and ear canal normal.  Left Ear: Hearing, tympanic membrane, external ear and ear canal normal.  Nose: Nose normal.  Mouth/Throat: Uvula is midline, oropharynx is clear and moist and mucous membranes are normal.  Eyes: Conjunctivae and EOM are normal. Pupils are equal, round, and reactive to light.  Neck: Neck supple. No JVD present. No tracheal deviation present. No thyromegaly present.  Cardiovascular: Normal rate, regular rhythm, normal heart sounds and intact distal pulses.   Pulmonary/Chest: Effort normal and breath sounds normal.  Abdominal: Soft. Bowel sounds are normal. She exhibits no  distension and no mass. There is no tenderness. There is no rebound and no guarding.  Musculoskeletal: Normal range of motion. She exhibits no edema or tenderness.  Lymphadenopathy:    She has no cervical adenopathy.  Neurological: She is alert and oriented to person, place, and time. She has normal reflexes. No cranial nerve deficit. She exhibits normal muscle tone. Coordination normal.  Skin: Skin is warm and dry.  Psychiatric: She has a normal mood and affect. Her behavior is normal. Judgment and thought content normal.       Assessment & Plan:   During the course of the visit the patient was educated and counseled about appropriate screening and preventive services including:    Pneumococcal vaccine   Influenza vaccine  Td vaccine  Glaucoma screening  Nutrition counseling   Diet review for nutrition referral? Yes ____  Not Indicated _X___   Patient Instructions (the written plan) was given to the patient.  Medicare Attestation I have personally reviewed: The patient's medical and social history Their use of alcohol, tobacco or illicit drugs Their current medications and supplements The patient's functional ability including ADLs,fall risks, home safety risks, cognitive, and hearing and visual impairment Diet and physical activities Evidence for depression or mood disorders  The patient's weight, height, BMI,  have been recorded in the chart.  I have made referrals, counseling, and provided education to the patient based on review of the above and I have provided the patient with a written personalized care plan for preventive services.     Problem List Items Addressed This Visit      Other   Mixed hyperlipidemia    Well controlled with lifestyle management. Obtain lipid profile. Continue lifestyle management pending lipid profile results.      Routine general medical examination at a health care facility    1) Anticipatory Guidance: Discussed importance of  wearing a seatbelt while driving and not texting while driving; changing batteries in smoke detector at least once annually; wearing suntan lotion when outside; eating a balanced and moderate diet; getting physical activity at least 30 minutes per day.  2) Immunizations / Screenings / Labs:  All immunizations are up-to-date per recommendations. All screenings are up-to-date per recommendations. Obtain CBC, CMET, and lipid profile.    Overall well exam with risk factors  for cardiovascular disease including hyperlipidemia. She has continued to lose weight with a BMI of 19. She does continue to care for her husband and notes situation at home has improved. Recommend supplementing meals with nutritional supplement as needed and increase protein intake. Continue other healthy lifestyle behaviors and choices. Follow-up prevention exam in 1 year. Follow-up office visit pending blood work and for chronic conditions.       Relevant Orders   CBC (Completed)   Comprehensive metabolic panel (Completed)   Lipid panel (Completed)   Medicare annual wellness visit, subsequent - Primary    Reviewed and updated patient's medical, surgical, family and social history. Medications and allergies were also reviewed. Basic screenings for depression, activities of daily living, hearing, cognition and safety were performed. Provider list was updated and health plan was provided to the patient.            I have discontinued Ms. Payton fluticasone and cetirizine. I am also having her maintain her ALIGN and ALPRAZolam.   Meds ordered this encounter  Medications  . ALPRAZolam (XANAX) 0.25 MG tablet    Sig: TAKE ONE TABLET BY MOUTH AT BEDTIME AS NEEDED FOR ANXIETY    Dispense:  30 tablet    Refill:  2    Order Specific Question:   Supervising Provider    Answer:   Pricilla Holm A [[id number]]     Follow-up: Return in about 3 months (around 08/01/2017), or if symptoms worsen or fail to improve, for Weight  check.   Mauricio Po, FNP

## 2017-05-01 NOTE — Assessment & Plan Note (Signed)
Well controlled with lifestyle management. Obtain lipid profile. Continue lifestyle management pending lipid profile results.

## 2017-05-01 NOTE — Assessment & Plan Note (Signed)
Reviewed and updated patient's medical, surgical, family and social history. Medications and allergies were also reviewed. Basic screenings for depression, activities of daily living, hearing, cognition and safety were performed. Provider list was updated and health plan was provided to the patient.  

## 2017-05-01 NOTE — Patient Instructions (Signed)
Thank you for choosing [facility].  SUMMARY AND INSTRUCTIONS:  Please continue to eat frequent small meals and supplement with Boost, Ensure or Protein.  Goal of 50 grams of protein per day.   Continue to take alprazolam as prescribed.   Medication:  Your prescription(s) have been submitted to your pharmacy or been printed and provided for you. Please take as directed and contact our office if you believe you are having problem(s) with the medication(s) or have any questions.  Labs:  Please stop by the lab on the lower level of the building for your blood work. Your results will be released to [place] (or called to you) after review, usually within 72 hours after test completion. If any changes need to be made, you will be notified at that same time.  1.) The lab is open from 7:30am to 5:30 pm Monday-Friday 2.) No appointment is necessary 3.) Fasting (if needed) is 6-8 hours after food and drink; black coffee and water are okay    Follow up:  If your symptoms worsen or fail to improve, please contact our office for further instruction, or in case of emergency go directly to the emergency room at the closest medical facility.   Health Maintenance  Topic Date Due  . INFLUENZA VACCINE  06/24/2017  . TETANUS/TDAP  11/20/2021  . DEXA SCAN  Completed  . PNA vac Low Risk Adult  Completed     Health Maintenance for Postmenopausal Women Menopause is a normal process in which your reproductive ability comes to an end. This process happens gradually over a span of months to years, usually between the ages of 66 and 23. Menopause is complete when you have missed 12 consecutive menstrual periods. It is important to talk with your health care provider about some of the most common conditions that affect postmenopausal women, such as heart disease, cancer, and bone loss (osteoporosis). Adopting a healthy lifestyle and getting preventive care can help to promote your health and  wellness. Those actions can also lower your chances of developing some of these common conditions. What should I know about menopause? During menopause, you may experience a number of symptoms, such as:  Moderate-to-severe hot flashes.  Night sweats.  Decrease in sex drive.  Mood swings.  Headaches.  Tiredness.  Irritability.  Memory problems.  Insomnia.  Choosing to treat or not to treat menopausal changes is an individual decision that you make with your health care provider. What should I know about hormone replacement therapy and supplements? Hormone therapy products are effective for treating symptoms that are associated with menopause, such as hot flashes and night sweats. Hormone replacement carries certain risks, especially as you become older. If you are thinking about using estrogen or estrogen with progestin treatments, discuss the benefits and risks with your health care provider. What should I know about heart disease and stroke? Heart disease, heart attack, and stroke become more likely as you age. This may be due, in part, to the hormonal changes that your body experiences during menopause. These can affect how your body processes dietary fats, triglycerides, and cholesterol. Heart attack and stroke are both medical emergencies. There are many things that you can do to help prevent heart disease and stroke:  Have your blood pressure checked at least every 1-2 years. High blood pressure causes heart disease and increases the risk of stroke.  If you are 16-19 years old, ask your health care provider if you should take aspirin to prevent a heart attack  or a stroke.  Do not use any tobacco products, including cigarettes, chewing tobacco, or electronic cigarettes. If you need help quitting, ask your health care provider.  It is important to eat a healthy diet and maintain a healthy weight. ? Be sure to include plenty of vegetables, fruits, low-fat dairy products, and  lean protein. ? Avoid eating foods that are high in solid fats, added sugars, or salt (sodium).  Get regular exercise. This is one of the most important things that you can do for your health. ? Try to exercise for at least 150 minutes each week. The type of exercise that you do should increase your heart rate and make you sweat. This is known as moderate-intensity exercise. ? Try to do strengthening exercises at least twice each week. Do these in addition to the moderate-intensity exercise.  Know your numbers.Ask your health care provider to check your cholesterol and your blood glucose. Continue to have your blood tested as directed by your health care provider.  What should I know about cancer screening? There are several types of cancer. Take the following steps to reduce your risk and to catch any cancer development as early as possible. Breast Cancer  Practice breast self-awareness. ? This means understanding how your breasts normally appear and feel. ? It also means doing regular breast self-exams. Let your health care provider know about any changes, no matter how small.  If you are 49 or older, have a clinician do a breast exam (clinical breast exam or CBE) every year. Depending on your age, family history, and medical history, it may be recommended that you also have a yearly breast X-ray (mammogram).  If you have a family history of breast cancer, talk with your health care provider about genetic screening.  If you are at high risk for breast cancer, talk with your health care provider about having an MRI and a mammogram every year.  Breast cancer (BRCA) gene test is recommended for women who have family members with BRCA-related cancers. Results of the assessment will determine the need for genetic counseling and BRCA1 and for BRCA2 testing. BRCA-related cancers include these types: ? Breast. This occurs in males or females. ? Ovarian. ? Tubal. This may also be called fallopian  tube cancer. ? Cancer of the abdominal or pelvic lining (peritoneal cancer). ? Prostate. ? Pancreatic.  Cervical, Uterine, and Ovarian Cancer Your health care provider may recommend that you be screened regularly for cancer of the pelvic organs. These include your ovaries, uterus, and vagina. This screening involves a pelvic exam, which includes checking for microscopic changes to the surface of your cervix (Pap test).  For women ages 21-65, health care providers may recommend a pelvic exam and a Pap test every three years. For women ages 67-65, they may recommend the Pap test and pelvic exam, combined with testing for human papilloma virus (HPV), every five years. Some types of HPV increase your risk of cervical cancer. Testing for HPV may also be done on women of any age who have unclear Pap test results.  Other health care providers may not recommend any screening for nonpregnant women who are considered low risk for pelvic cancer and have no symptoms. Ask your health care provider if a screening pelvic exam is right for you.  If you have had past treatment for cervical cancer or a condition that could lead to cancer, you need Pap tests and screening for cancer for at least 20 years after your treatment. If Pap  tests have been discontinued for you, your risk factors (such as having a new sexual partner) need to be reassessed to determine if you should start having screenings again. Some women have medical problems that increase the chance of getting cervical cancer. In these cases, your health care provider may recommend that you have screening and Pap tests more often.  If you have a family history of uterine cancer or ovarian cancer, talk with your health care provider about genetic screening.  If you have vaginal bleeding after reaching menopause, tell your health care provider.  There are currently no reliable tests available to screen for ovarian cancer.  Lung Cancer Lung cancer  screening is recommended for adults 19-63 years old who are at high risk for lung cancer because of a history of smoking. A yearly low-dose CT scan of the lungs is recommended if you:  Currently smoke.  Have a history of at least 30 pack-years of smoking and you currently smoke or have quit within the past 15 years. A pack-year is smoking an average of one pack of cigarettes per day for one year.  Yearly screening should:  Continue until it has been 15 years since you quit.  Stop if you develop a health problem that would prevent you from having lung cancer treatment.  Colorectal Cancer  This type of cancer can be detected and can often be prevented.  Routine colorectal cancer screening usually begins at age 31 and continues through age 61.  If you have risk factors for colon cancer, your health care provider may recommend that you be screened at an earlier age.  If you have a family history of colorectal cancer, talk with your health care provider about genetic screening.  Your health care provider may also recommend using home test kits to check for hidden blood in your stool.  A small camera at the end of a tube can be used to examine your colon directly (sigmoidoscopy or colonoscopy). This is done to check for the earliest forms of colorectal cancer.  Direct examination of the colon should be repeated every 5-10 years until age 59. However, if early forms of precancerous polyps or small growths are found or if you have a family history or genetic risk for colorectal cancer, you may need to be screened more often.  Skin Cancer  Check your skin from head to toe regularly.  Monitor any moles. Be sure to tell your health care provider: ? About any new moles or changes in moles, especially if there is a change in a mole's shape or color. ? If you have a mole that is larger than the size of a pencil eraser.  If any of your family members has a history of skin cancer, especially at a  young age, talk with your health care provider about genetic screening.  Always use sunscreen. Apply sunscreen liberally and repeatedly throughout the day.  Whenever you are outside, protect yourself by wearing long sleeves, pants, a wide-brimmed hat, and sunglasses.  What should I know about osteoporosis? Osteoporosis is a condition in which bone destruction happens more quickly than new bone creation. After menopause, you may be at an increased risk for osteoporosis. To help prevent osteoporosis or the bone fractures that can happen because of osteoporosis, the following is recommended:  If you are 43-35 years old, get at least 1,000 mg of calcium and at least 600 mg of vitamin D per day.  If you are older than age 39 but younger  than age 92, get at least 1,200 mg of calcium and at least 600 mg of vitamin D per day.  If you are older than age 49, get at least 1,200 mg of calcium and at least 800 mg of vitamin D per day.  Smoking and excessive alcohol intake increase the risk of osteoporosis. Eat foods that are rich in calcium and vitamin D, and do weight-bearing exercises several times each week as directed by your health care provider. What should I know about how menopause affects my mental health? Depression may occur at any age, but it is more common as you become older. Common symptoms of depression include:  Low or sad mood.  Changes in sleep patterns.  Changes in appetite or eating patterns.  Feeling an overall lack of motivation or enjoyment of activities that you previously enjoyed.  Frequent crying spells.  Talk with your health care provider if you think that you are experiencing depression. What should I know about immunizations? It is important that you get and maintain your immunizations. These include:  Tetanus, diphtheria, and pertussis (Tdap) booster vaccine.  Influenza every year before the flu season begins.  Pneumonia vaccine.  Shingles vaccine.  Your  health care provider may also recommend other immunizations. This information is not intended to replace advice given to you by your health care provider. Make sure you discuss any questions you have with your health care provider. Document Released: 01/02/2006 Document Revised: 05/30/2016 Document Reviewed: 08/14/2015 Elsevier Interactive Patient Education  2018 [employer].

## 2017-05-01 NOTE — Assessment & Plan Note (Signed)
1) Anticipatory Guidance: Discussed importance of wearing a seatbelt while driving and not texting while driving; changing batteries in smoke detector at least once annually; wearing suntan lotion when outside; eating a balanced and moderate diet; getting physical activity at least 30 minutes per day.  2) Immunizations / Screenings / Labs:  All immunizations are up-to-date per recommendations. All screenings are up-to-date per recommendations. Obtain CBC, CMET, and lipid profile.    Overall well exam with risk factors for cardiovascular disease including hyperlipidemia. She has continued to lose weight with a BMI of 19. She does continue to care for her husband and notes situation at home has improved. Recommend supplementing meals with nutritional supplement as needed and increase protein intake. Continue other healthy lifestyle behaviors and choices. Follow-up prevention exam in 1 year. Follow-up office visit pending blood work and for chronic conditions.

## 2017-08-18 ENCOUNTER — Ambulatory Visit (INDEPENDENT_AMBULATORY_CARE_PROVIDER_SITE_OTHER): Payer: Medicare Other

## 2017-08-18 DIAGNOSIS — Z23 Encounter for immunization: Secondary | ICD-10-CM | POA: Diagnosis not present

## 2017-09-23 ENCOUNTER — Encounter: Payer: Self-pay | Admitting: Family Medicine

## 2017-09-23 ENCOUNTER — Ambulatory Visit (INDEPENDENT_AMBULATORY_CARE_PROVIDER_SITE_OTHER): Payer: Medicare Other | Admitting: Family Medicine

## 2017-09-23 VITALS — BP 128/68 | HR 77 | Temp 97.9°F | Ht 67.5 in | Wt 128.0 lb

## 2017-09-23 DIAGNOSIS — M5432 Sciatica, left side: Secondary | ICD-10-CM | POA: Diagnosis not present

## 2017-09-23 MED ORDER — NAPROXEN 500 MG PO TABS: 500.0000 mg | ORAL_TABLET | Freq: Two times a day (BID) | ORAL | 0 refills | Status: DC

## 2017-09-23 NOTE — Assessment & Plan Note (Signed)
This appears to be a dynamic process with compression of the nerve. Does not appear to be associated with lumbar pathology. This is worsening with her walking. Could be a scissoring mechanism while she is walking they could be compressing the nerve - Naproxen - Counseled on other supplements to take for anti-inflammatory purposes - Counseled on home exercise therapy - If no improvement consider EMG and initiating gabapentin

## 2017-09-23 NOTE — Patient Instructions (Signed)
Thank you for coming in,   Please take the naproxen for 10 days straight and then as needed. You can take tylenol with this medication.   Please try the exercises in a couple of weeks.   Please try different shoes to see if that helps.   Please follow up with me if your symptoms do not improve.    Please feel free to call with any questions or concerns at any time, at [phone]. --Dr. Raeford Razor

## 2017-09-23 NOTE — Progress Notes (Signed)
Kelli Nielsen - 81 y.o. female MRN [id number]  Date of birth: [birth year]  SUBJECTIVE:  Including CC & ROS.  Chief Complaint  Patient presents with  . Left leg pain    pain is located on the exterior portion of her calf and extends up to her hip. She states the pain is not constant. Denies injuries. She took some tylenol, which she stats helped some.    Kelli Nielsen is a 81 y.o. female that is presenting with left lateral leg pain. This pain has been occurring for about 2 weeks. The pain is a cramping sensation. She has been taking Tylenol that seems improved the pain. The pain is worse after she walks for period time. She denies any injury or history of surgery in her back or her hip. Denies any numbness or tingling. Pain seems be getting worse. It started in a gradual fashion..  Bone density from 2001 shows normal bone mass.   Review of Systems  Constitutional: Negative for fever.  Musculoskeletal: Positive for myalgias. Negative for back pain, gait problem and joint swelling.  Skin: Negative for color change.  Neurological: Negative for weakness.    HISTORY: Past Medical, Surgical, Social, and Family History Reviewed & Updated per EMR.   Pertinent Historical Findings include:  Past Medical History:  Diagnosis Date  . Adenomatous colon polyp   . Anxiety     Past Surgical History:  Procedure Laterality Date  . APPENDECTOMY  1959   peritonitis  . BREAST BIOPSY     benign  . COLONOSCOPY  2014   negative; Dr Deatra Ina  . TONSILLECTOMY  1959    Allergies  Allergen Reactions  . Azithromycin     REACTION: elevated liver enzymes  . Penicillins Hives    Hives     Family History  Problem Relation Age of Onset  . Heart disease Mother        ? MI  . Diabetes Neg Hx   . Cancer Neg Hx   . Stroke Neg Hx      Social History   Social History  . Marital status: Married    Spouse name: N/A  . Number of children: 1  . Years of education: 82   Occupational History  . Not on  file.   Social History Main Topics  . Smoking status: Never Smoker  . Smokeless tobacco: Never Used  . Alcohol use 1.2 oz/week    2 Glasses of wine per week  . Drug use: No  . Sexual activity: Not on file   Other Topics Concern  . Not on file   Social History Narrative   Fun: Not much fun    Denies abuse and feels safe at home.      PHYSICAL EXAM:  VS: BP 128/68 (BP Location: Left Arm, Patient Position: Sitting, Cuff Size: Normal)   Pulse 77   Temp 97.9 F (36.6 C) (Oral)   Ht 5' 7.5" (1.715 m)   Wt 128 lb (58.1 kg)   SpO2 100%   BMI 19.75 kg/m  Physical Exam Gen: NAD, alert, cooperative with exam, well-appearing ENT: normal lips, normal nasal mucosa,  Eye: normal EOM, normal conjunctiva and lids CV:  no edema, +2 pedal pulses   Resp: no accessory muscle use, non-labored,  Skin: no rashes, no areas of induration  Neuro: normal tone, normal sensation to touch Psych:  normal insight, alert and oriented MSK:  Back:  No tenderness palpation of the lumbar midline spine, greater trochanter, SI  joint, piriformis, or lumbar paraspinal muscles. Normal internal and external rotation of the hips. Normal strength to resistance with hip flexion. Normal knee flexion and extension. Normal strength resistance knee flexion-extension. Normal strength resistance with inversion and eversion of the ankle. Negative straight leg raise. Unable to complete [place]y [place] without resting her foot on the exam table.  Normal FADIR  Significant weakness with hip abduction. Neurovascularly intact      ASSESSMENT & PLAN:   Sciatica of left side This appears to be a dynamic process with compression of the nerve. Does not appear to be associated with lumbar pathology. This is worsening with her walking. Could be a scissoring mechanism while she is walking they could be compressing the nerve - Naproxen - Counseled on other supplements to take for anti-inflammatory purposes - Counseled on home  exercise therapy - If no improvement consider EMG and initiating gabapentin

## 2017-10-19 ENCOUNTER — Other Ambulatory Visit: Payer: Self-pay | Admitting: Family

## 2017-10-20 ENCOUNTER — Other Ambulatory Visit: Payer: Self-pay | Admitting: Family

## 2017-11-06 ENCOUNTER — Ambulatory Visit: Payer: Self-pay | Admitting: *Deleted

## 2017-11-06 NOTE — Telephone Encounter (Signed)
She called in just wanting an appt with Dr. Jenny Reichmann not to be triaged.   She is feeling tired, has had a cold, and wants to discuss care options for taking care of husband with Alzheimer;s. I made her an appt with Dr. Jenny Reichmann at 9:00 on Nov 18, 2017.  It was her request due to her work schedule.

## 2017-11-10 DIAGNOSIS — H2513 Age-related nuclear cataract, bilateral: Secondary | ICD-10-CM | POA: Diagnosis not present

## 2017-11-10 DIAGNOSIS — H5203 Hypermetropia, bilateral: Secondary | ICD-10-CM | POA: Diagnosis not present

## 2017-11-10 DIAGNOSIS — H524 Presbyopia: Secondary | ICD-10-CM | POA: Diagnosis not present

## 2017-11-10 DIAGNOSIS — H269 Unspecified cataract: Secondary | ICD-10-CM | POA: Diagnosis not present

## 2017-11-10 DIAGNOSIS — H52203 Unspecified astigmatism, bilateral: Secondary | ICD-10-CM | POA: Diagnosis not present

## 2017-11-18 ENCOUNTER — Other Ambulatory Visit (INDEPENDENT_AMBULATORY_CARE_PROVIDER_SITE_OTHER): Payer: Medicare Other

## 2017-11-18 ENCOUNTER — Encounter: Payer: Self-pay | Admitting: Internal Medicine

## 2017-11-18 ENCOUNTER — Ambulatory Visit: Payer: Medicare Other | Admitting: Internal Medicine

## 2017-11-18 VITALS — BP 136/76 | HR 84 | Temp 97.7°F | Ht 67.5 in | Wt 123.0 lb

## 2017-11-18 DIAGNOSIS — R739 Hyperglycemia, unspecified: Secondary | ICD-10-CM

## 2017-11-18 DIAGNOSIS — G4700 Insomnia, unspecified: Secondary | ICD-10-CM

## 2017-11-18 DIAGNOSIS — F418 Other specified anxiety disorders: Secondary | ICD-10-CM | POA: Diagnosis not present

## 2017-11-18 DIAGNOSIS — E2839 Other primary ovarian failure: Secondary | ICD-10-CM | POA: Diagnosis not present

## 2017-11-18 DIAGNOSIS — Z0001 Encounter for general adult medical examination with abnormal findings: Secondary | ICD-10-CM

## 2017-11-18 DIAGNOSIS — R718 Other abnormality of red blood cells: Secondary | ICD-10-CM

## 2017-11-18 DIAGNOSIS — Z0000 Encounter for general adult medical examination without abnormal findings: Secondary | ICD-10-CM | POA: Diagnosis not present

## 2017-11-18 LAB — CBC WITH DIFFERENTIAL/PLATELET
BASOS ABS: 0 10*3/uL (ref 0.0–0.1)
Basophils Relative: 0.8 % (ref 0.0–3.0)
EOS ABS: 0 10*3/uL (ref 0.0–0.7)
Eosinophils Relative: 1 % (ref 0.0–5.0)
HEMATOCRIT: 41.5 % (ref 36.0–46.0)
Hemoglobin: 13.7 g/dL (ref 12.0–15.0)
LYMPHS PCT: 28.6 % (ref 12.0–46.0)
Lymphs Abs: 1.3 10*3/uL (ref 0.7–4.0)
MCHC: 33 g/dL (ref 30.0–36.0)
MCV: 103.4 fl — ABNORMAL HIGH (ref 78.0–100.0)
MONO ABS: 0.5 10*3/uL (ref 0.1–1.0)
Monocytes Relative: 10.9 % (ref 3.0–12.0)
NEUTROS ABS: 2.7 10*3/uL (ref 1.4–7.7)
Neutrophils Relative %: 58.7 % (ref 43.0–77.0)
PLATELETS: 251 10*3/uL (ref 150.0–400.0)
RBC: 4.02 Mil/uL (ref 3.87–5.11)
RDW: 12.6 % (ref 11.5–15.5)
WBC: 4.6 10*3/uL (ref 4.0–10.5)

## 2017-11-18 LAB — URINALYSIS, ROUTINE W REFLEX MICROSCOPIC
BILIRUBIN URINE: NEGATIVE
HGB URINE DIPSTICK: NEGATIVE
KETONES UR: NEGATIVE
LEUKOCYTES UA: NEGATIVE
NITRITE: NEGATIVE
RBC / HPF: NONE SEEN (ref 0–?)
Specific Gravity, Urine: 1.01 (ref 1.000–1.030)
Total Protein, Urine: NEGATIVE
UROBILINOGEN UA: 0.2 (ref 0.0–1.0)
Urine Glucose: NEGATIVE
WBC, UA: NONE SEEN (ref 0–?)
pH: 6 (ref 5.0–8.0)

## 2017-11-18 LAB — BASIC METABOLIC PANEL
BUN: 24 mg/dL — AB (ref 6–23)
CHLORIDE: 104 meq/L (ref 96–112)
CO2: 28 meq/L (ref 19–32)
CREATININE: 0.6 mg/dL (ref 0.40–1.20)
Calcium: 9.1 mg/dL (ref 8.4–10.5)
GFR: 102.33 mL/min (ref >60.00)
GLUCOSE: 89 mg/dL (ref 70–99)
POTASSIUM: 4.1 meq/L (ref 3.5–5.1)
Sodium: 140 mEq/L (ref 135–145)

## 2017-11-18 LAB — VITAMIN B12: VITAMIN B 12: 381 pg/mL (ref 211–911)

## 2017-11-18 LAB — LIPID PANEL
CHOL/HDL RATIO: 2
CHOLESTEROL: 152 mg/dL (ref 0–200)
HDL: 69.7 mg/dL (ref >39.00)
LDL CALC: 66 mg/dL (ref 0–99)
NONHDL: 82.26
Triglycerides: 81 mg/dL (ref 0.0–149.0)
VLDL: 16.2 mg/dL (ref 0.0–40.0)

## 2017-11-18 LAB — HEPATIC FUNCTION PANEL
ALK PHOS: 42 U/L (ref 39–117)
ALT: 17 U/L (ref 0–35)
AST: 25 U/L (ref 0–37)
Albumin: 4.3 g/dL (ref 3.5–5.2)
BILIRUBIN DIRECT: 0.1 mg/dL (ref 0.0–0.3)
BILIRUBIN TOTAL: 0.7 mg/dL (ref 0.2–1.2)
TOTAL PROTEIN: 7.1 g/dL (ref 6.0–8.3)

## 2017-11-18 LAB — HEMOGLOBIN A1C: HEMOGLOBIN A1C: 5.7 % (ref 4.6–6.5)

## 2017-11-18 LAB — TSH: TSH: 1.31 u[IU]/mL (ref 0.35–4.50)

## 2017-11-18 MED ORDER — CITALOPRAM HYDROBROMIDE 10 MG PO TABS: 10.0000 mg | ORAL_TABLET | Freq: Every day | ORAL | 3 refills | Status: DC

## 2017-11-18 MED ORDER — ASPIRIN 81 MG PO TBEC: 81.0000 mg | DELAYED_RELEASE_TABLET | Freq: Every day | ORAL | 12 refills | Status: DC

## 2017-11-18 MED ORDER — ALPRAZOLAM 0.25 MG PO TABS: ORAL_TABLET | ORAL | 5 refills | Status: DC

## 2017-11-18 NOTE — Progress Notes (Signed)
Subjective:    Patient ID: Kelli Nielsen, female    DOB: [birth year], 81 y.o.   MRN: [id number]  HPI Here for wellness and f/u;  Overall doing ok;  Pt denies Chest pain, worsening SOB, DOE, wheezing, orthopnea, PND, worsening LE edema, palpitations, dizziness or syncope.  Pt denies neurological change such as new headache, facial or extremity weakness.  Pt denies polydipsia, polyuria, or low sugar symptoms. Pt states overall good compliance with treatment and medications, good tolerability, and has been trying to follow appropriate diet.  Pt denies worsening depressive symptoms, suicidal ideation or panic. No fever, night sweats, wt loss, loss of appetite, or other constitutional symptoms.  Pt states good ability with ADL's, has low fall risk, home safety reviewed and adequate, no other significant changes in hearing or vision, and only occasionally active with exercise.  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   Hard to sleep as he moves in the night. Only way for sleep is the xanax.some nights, no hx of ause or dependence.  Has had mild worsening depressive symptoms, but on suicidal ideation, or panic; has ongoing anxiety.  Did have recent left leg pain , resolved quickly after last visit with sports medicine, and no need for further nsaid per pt. Past Medical History:  Diagnosis Date  . Adenomatous colon polyp   . Anxiety    Past Surgical History:  Procedure Laterality Date  . APPENDECTOMY  1959   peritonitis  . BREAST BIOPSY     benign  . COLONOSCOPY  2014   negative; Dr Deatra Ina  . TONSILLECTOMY  1959    reports that  has never smoked. she has never used smokeless tobacco. She reports that she drinks about 1.2 oz of alcohol per week. She reports that she does not use drugs. family history includes Heart disease in her mother. Allergies  Allergen Reactions  . Azithromycin     REACTION: elevated liver  enzymes  . Penicillins Hives    Hives    Current Outpatient Medications on File Prior to Visit  Medication Sig Dispense Refill  . Probiotic Product (ALIGN) 4 MG CAPS Take 1 capsule by mouth daily.       No current facility-administered medications on file prior to visit.    Review of Systems Constitutional: Negative for other unusual diaphoresis, sweats, appetite or weight changes HENT: Negative for other worsening hearing loss, ear pain, facial swelling, mouth sores or neck stiffness.   Eyes: Negative for other worsening pain, redness or other visual disturbance.  Respiratory: Negative for other stridor or swelling Cardiovascular: Negative for other palpitations or other chest pain  Gastrointestinal: Negative for worsening diarrhea or loose stools, blood in stool, distention or other pain Genitourinary: Negative for hematuria, flank pain or other change in urine volume.  Musculoskeletal: Negative for myalgias or other joint swelling.  Skin: Negative for other color change, or other wound or worsening drainage.  Neurological: Negative for other syncope or numbness. Hematological: Negative for other adenopathy or swelling Psychiatric/Behavioral: Negative for hallucinations, other worsening agitation, SI, self-injury, or new decreased concentration All other system neg per pt    Objective:   Physical Exam BP 136/76   Pulse 84   Temp 97.7 F (36.5 C) (Oral)   Ht 5' 7.5" (1.715 m)   Wt 123 lb (55.8 kg)   SpO2 99%   BMI 18.98 kg/m  VS noted,  Constitutional: Pt is  oriented to person, place, and time. Appears well-developed and well-nourished, in no significant distress and comfortable Head: Normocephalic and atraumatic  Eyes: Conjunctivae and EOM are normal. Pupils are equal, round, and reactive to light Right Ear: External ear normal without discharge Left Ear: External ear normal without discharge Nose: Nose without discharge or deformity Mouth/Throat: Oropharynx is without  other ulcerations and moist  Neck: Normal range of motion. Neck supple. No JVD present. No tracheal deviation present or significant neck LA or mass Cardiovascular: Normal rate, regular rhythm, normal heart sounds and intact distal pulses.   Pulmonary/Chest: WOB normal and breath sounds without rales or wheezing  Abdominal: Soft. Bowel sounds are normal. NT. No HSM  Musculoskeletal: Normal range of motion. Exhibits no edema Lymphadenopathy: Has no other cervical adenopathy.  Neurological: Pt is alert and oriented to person, place, and time. Pt has normal reflexes. No cranial nerve deficit. Motor grossly intact, Gait intact Skin: Skin is warm and dry. No rash noted or new ulcerations Psychiatric:  Has depressed mood and affect. Behavior is normal without agitation No other exam findings    Assessment & Plan:

## 2017-11-18 NOTE — Patient Instructions (Addendum)
Please consider starting aspirin 81 mg - 1 per day OTC  Please take all new medication as prescribed - the celexa  Please continue all other medications as before, and refills have been done if requested - the xanax  Please have the pharmacy call with any other refills you may need.  Please continue your efforts at being more active, low cholesterol diet, and weight control.  You are otherwise up to date with prevention measures today.  Please keep your appointments with your specialists as you may have planned  Please schedule the bone density test before leaving today at the scheduling desk (where you check out)  Please go to the LAB in the Basement (turn left off the elevator) for the tests to be done today  You will be contacted by phone if any changes need to be made immediately.  Otherwise, you will receive a letter about your results with an explanation, but please check with MyChart first.  Please remember to sign up for MyChart if you have not done so, as this will be important to you in the future with finding out test results, communicating by private email, and scheduling acute appointments online when needed.  Please return in 1 year for your yearly visit, or sooner if needed, with Lab testing done 3-5 days before

## 2017-11-19 NOTE — Assessment & Plan Note (Signed)
Asympt, for a1c with lab

## 2017-11-19 NOTE — Assessment & Plan Note (Signed)
Also for Vit B12 level with labs, rule out b12 deficiency

## 2017-11-19 NOTE — Assessment & Plan Note (Addendum)
Pt to start celexa 10 qd, verified no SI or HI, declines referrals for counseling or psychiatry  In addition to the time spent performing CPE, I spent an additional 25 minutes face to face,in which greater than 50% of this time was spent in counseling and coordination of care for patient's acute illness as documented, including the differential dx, treatment, further evaluation and other management of anxiety with depression, hyperglycemia, elevated MCV lab test, and chronic insomnia

## 2017-11-19 NOTE — Assessment & Plan Note (Signed)
Stable chronic, for xanax refill asd,  to f/u any worsening symptoms or concerns

## 2017-11-19 NOTE — Assessment & Plan Note (Signed)

## 2017-11-19 NOTE — Assessment & Plan Note (Signed)
Stable, for a1c with labs 

## 2018-02-10 ENCOUNTER — Encounter: Payer: Self-pay | Admitting: Physician Assistant

## 2018-02-10 ENCOUNTER — Ambulatory Visit: Payer: Medicare Other | Admitting: Physician Assistant

## 2018-02-10 VITALS — BP 148/82 | HR 94 | Temp 98.3°F | Resp 16 | Ht 68.0 in | Wt 125.0 lb

## 2018-02-10 DIAGNOSIS — J069 Acute upper respiratory infection, unspecified: Secondary | ICD-10-CM | POA: Diagnosis not present

## 2018-02-10 MED ORDER — IPRATROPIUM BROMIDE 0.03 % NA SOLN: 2.0000 | Freq: Two times a day (BID) | NASAL | 0 refills | Status: DC

## 2018-02-10 MED ORDER — DOXYCYCLINE HYCLATE 100 MG PO CAPS: 100.0000 mg | ORAL_CAPSULE | Freq: Two times a day (BID) | ORAL | 0 refills | Status: AC

## 2018-02-10 MED ORDER — GUAIFENESIN ER 1200 MG PO TB12: 1.0000 | ORAL_TABLET | Freq: Two times a day (BID) | ORAL | 1 refills | Status: DC | PRN

## 2018-02-10 MED ORDER — BENZONATATE 100 MG PO CAPS: 100.0000 mg | ORAL_CAPSULE | Freq: Three times a day (TID) | ORAL | 0 refills | Status: DC | PRN

## 2018-02-10 NOTE — Progress Notes (Signed)
PRIMARY CARE AT [place], [place] [address] [phone]  Date:  02/10/2018   Name:  Kelli Nielsen   DOB:  18-Oct-1936   MRN:  [id number]  PCP:  Biagio Borg, MD    History of Present Illness:  Kelli Nielsen is a 82 y.o. female patient who presents to PCP with  Chief Complaint  Patient presents with  . Cough      2 days ago, she was having malaise. She is not congested.   She has coughing when she is laying down.  Productive mucus.  She is very tired, but does not notice any windedness, sob, or dypsnea, but feels fatigued with any movement.   She has no runny nose She can not note any post nasal draingae No sore throat She has not had to take allergy medicine in the past.   She does not note any body aches.   She has had the flu vaccine She has not been around any known contacts, but does work in Therap[employer], art.    Patient Active Problem List   Diagnosis Date Noted  . Insomnia 11/18/2017  . Hyperglycemia 11/18/2017  . Elevated MCV 11/18/2017  . Sciatica of left side 09/23/2017  . Dysuria 07/17/2016  . Encounter for well adult exam with abnormal findings 03/27/2016  . Medicare annual wellness visit, subsequent 03/27/2016  . Cough 11/07/2014  . Depression with anxiety 05/05/2014  . Mixed hyperlipidemia 05/05/2014  . Elevated blood pressure reading without diagnosis of hypertension 05/05/2014  . Vaginitis and vulvovaginitis 05/19/2013  . Hypopotassemia 10/17/2008  . HEMATOCHEZIA 10/07/2008  . DISORDER, MITRAL VALVE 06/17/2007    Past Medical History:  Diagnosis Date  . Adenomatous colon polyp   . Anxiety     Past Surgical History:  Procedure Laterality Date  . APPENDECTOMY  1959   peritonitis  . BREAST BIOPSY     benign  . COLONOSCOPY  2014   negative; Dr Deatra Ina  . TONSILLECTOMY  1959    Social History   Tobacco Use  . Smoking status: Never Smoker  . Smokeless tobacco: Never Used  Substance Use Topics  . Alcohol use: Yes   Alcohol/week: 1.2 oz    Types: 2 Glasses of wine per week  . Drug use: No    Family History  Problem Relation Age of Onset  . Heart disease Mother        ? MI  . Diabetes Neg Hx   . Cancer Neg Hx   . Stroke Neg Hx     Allergies  Allergen Reactions  . Azithromycin     REACTION: elevated liver enzymes  . Penicillins Hives    Hives     Medication list has been reviewed and updated.  Current Outpatient Medications on File Prior to Visit  Medication Sig Dispense Refill  . ALPRAZolam (XANAX) 0.25 MG tablet TAKE ONE TABLET BY MOUTH AT BEDTIME AS NEEDED FOR ANXIETY 30 tablet 5  . aspirin 81 MG EC tablet Take 1 tablet (81 mg total) by mouth daily. Swallow whole. 30 tablet 12  . citalopram (CELEXA) 10 MG tablet Take 1 tablet (10 mg total) by mouth daily. 90 tablet 3  . Probiotic Product (ALIGN) 4 MG CAPS Take 1 capsule by mouth daily.       No current facility-administered medications on file prior to visit.     ROS ROS otherwise unremarkable unless listed above.  Physical Examination: BP (!) 148/82 (BP Location: Right Arm, Patient Position:  Sitting, Cuff Size: Normal)   Pulse 94   Temp 98.3 F (36.8 C) (Oral)   Resp 16   Ht 5\' 8"  (1.727 m)   Wt 125 lb (56.7 kg)   SpO2 96%   BMI 19.01 kg/m  Ideal Body Weight: Weight in (lb) to have BMI = 25: 164.1  Physical Exam  Constitutional: She is oriented to person, place, and time. She appears well-developed and well-nourished. No distress.  HENT:  Head: Normocephalic and atraumatic.  Right Ear: Tympanic membrane, external ear and ear canal normal.  Left Ear: Tympanic membrane, external ear and ear canal normal.  Nose: Mucosal edema and rhinorrhea present. Right sinus exhibits no maxillary sinus tenderness and no frontal sinus tenderness. Left sinus exhibits no maxillary sinus tenderness and no frontal sinus tenderness.  Mouth/Throat: No uvula swelling. No oropharyngeal exudate, posterior oropharyngeal edema or posterior  oropharyngeal erythema.  Eyes: Pupils are equal, round, and reactive to light. Conjunctivae and EOM are normal.  Cardiovascular: Normal rate and regular rhythm. Exam reveals no gallop, no distant heart sounds and no friction rub.  No murmur heard. Pulmonary/Chest: Effort normal. No respiratory distress. She has no decreased breath sounds. She has no wheezes. She has no rhonchi.  Lymphadenopathy:       Head (right side): No submandibular, no tonsillar, no preauricular and no posterior auricular adenopathy present.       Head (left side): No submandibular, no tonsillar, no preauricular and no posterior auricular adenopathy present.  Neurological: She is alert and oriented to person, place, and time.  Skin: She is not diaphoretic.  Psychiatric: She has a normal mood and affect. Her behavior is normal.     Assessment and Plan: [name] is a 82 y.o. female who is here today for cc of  Chief Complaint  Patient presents with  . Cough   Acute upper respiratory infection - Plan: Guaifenesin (MUCINEX MAXIMUM STRENGTH) 1200 MG TB12, ipratropium (ATROVENT) 0.03 % nasal spray, benzonatate (TESSALON) 100 MG capsule  Ivar Drape, PA-C [facility] 3/29/20199:48 AM

## 2018-02-10 NOTE — Patient Instructions (Addendum)
Please make sure you are hydrating well with 64 oz of water if not more.  This is the only way the mucinex will work.   If your symptoms are not improved in 7 days, or worsening symptoms, fill the prescription.    Upper Respiratory Infection, Adult Most upper respiratory infections (URIs) are caused by a virus. A URI affects the nose, throat, and upper air passages. The most common type of URI is often called "the common cold." Follow these instructions at home:  Take medicines only as told by your doctor.  Gargle warm saltwater or take cough drops to comfort your throat as told by your doctor.  Use a warm mist humidifier or inhale steam from a shower to increase air moisture. This may make it easier to breathe.  Drink enough fluid to keep your pee (urine) clear or pale yellow.  Eat soups and other clear broths.  Have a healthy diet.  Rest as needed.  Go back to work when your fever is gone or your doctor says it is okay. ? You may need to stay home longer to avoid giving your URI to others. ? You can also wear a face mask and wash your hands often to prevent spread of the virus.  Use your inhaler more if you have asthma.  Do not use any tobacco products, including cigarettes, chewing tobacco, or electronic cigarettes. If you need help quitting, ask your doctor. Contact a doctor if:  You are getting worse, not better.  Your symptoms are not helped by medicine.  You have chills.  You are getting more short of breath.  You have brown or red mucus.  You have yellow or brown discharge from your nose.  You have pain in your face, especially when you bend forward.  You have a fever.  You have puffy (swollen) neck glands.  You have pain while swallowing.  You have white areas in the back of your throat. Get help right away if:  You have very bad or constant: ? Headache. ? Ear pain. ? Pain in your forehead, behind your eyes, and over your cheekbones (sinus  pain). ? Chest pain.  You have long-lasting (chronic) lung disease and any of the following: ? Wheezing. ? Long-lasting cough. ? Coughing up blood. ? A change in your usual mucus.  You have a stiff neck.  You have changes in your: ? Vision. ? Hearing. ? Thinking. ? Mood. This information is not intended to replace advice given to you by your health care provider. Make sure you discuss any questions you have with your health care provider. Document Released: 04/28/2008 Document Revised: 07/13/2016 Document Reviewed: 02/15/2014 Elsevier Interactive Patient Education  2018 [employer].   IF you received an x-ray today, you will receive an invoice from [facility]. Please contact [facility] at [phone] with questions or concerns regarding your invoice.   IF you received labwork today, you will receive an invoice from [facility]. Please contact [facility] at [phone] with questions or concerns regarding your invoice.   Our billing staff will not be able to assist you with questions regarding bills from these companies.  You will be contacted with the lab results as soon as they are available. The fastest way to get your results is to activate your My Chart account. Instructions are located on the last page of this paperwork. If you have not heard from [place] regarding the results in 2 weeks, please contact this office.

## 2018-02-19 ENCOUNTER — Encounter: Payer: Self-pay | Admitting: Physician Assistant

## 2018-02-24 ENCOUNTER — Encounter: Payer: Self-pay | Admitting: Physician Assistant

## 2018-04-09 ENCOUNTER — Other Ambulatory Visit: Payer: Self-pay | Admitting: Family Medicine

## 2018-04-12 ENCOUNTER — Other Ambulatory Visit: Payer: Self-pay | Admitting: Family Medicine

## 2018-04-16 ENCOUNTER — Ambulatory Visit (INDEPENDENT_AMBULATORY_CARE_PROVIDER_SITE_OTHER): Payer: Medicare Other | Admitting: Internal Medicine

## 2018-04-16 ENCOUNTER — Encounter: Payer: Self-pay | Admitting: Internal Medicine

## 2018-04-16 ENCOUNTER — Other Ambulatory Visit (INDEPENDENT_AMBULATORY_CARE_PROVIDER_SITE_OTHER): Payer: Medicare Other

## 2018-04-16 VITALS — BP 160/60 | HR 89 | Temp 98.1°F | Ht 68.0 in | Wt 121.5 lb

## 2018-04-16 DIAGNOSIS — R197 Diarrhea, unspecified: Secondary | ICD-10-CM

## 2018-04-16 DIAGNOSIS — R1084 Generalized abdominal pain: Secondary | ICD-10-CM

## 2018-04-16 DIAGNOSIS — R109 Unspecified abdominal pain: Secondary | ICD-10-CM | POA: Insufficient documentation

## 2018-04-16 DIAGNOSIS — K921 Melena: Secondary | ICD-10-CM | POA: Diagnosis not present

## 2018-04-16 LAB — CBC WITH DIFFERENTIAL/PLATELET
Basophils Absolute: 0 10*3/uL (ref 0.0–0.1)
Basophils Relative: 0.5 % (ref 0.0–3.0)
EOS PCT: 0.7 % (ref 0.0–5.0)
Eosinophils Absolute: 0 10*3/uL (ref 0.0–0.7)
HCT: 40.9 % (ref 36.0–46.0)
Hemoglobin: 13.7 g/dL (ref 12.0–15.0)
LYMPHS ABS: 1.1 10*3/uL (ref 0.7–4.0)
Lymphocytes Relative: 21.5 % (ref 12.0–46.0)
MCHC: 33.4 g/dL (ref 30.0–36.0)
MCV: 101.7 fl — ABNORMAL HIGH (ref 78.0–100.0)
MONO ABS: 0.5 10*3/uL (ref 0.1–1.0)
Monocytes Relative: 8.5 % (ref 3.0–12.0)
Neutro Abs: 3.7 10*3/uL (ref 1.4–7.7)
Neutrophils Relative %: 68.8 % (ref 43.0–77.0)
Platelets: 217 10*3/uL (ref 150.0–400.0)
RBC: 4.02 Mil/uL (ref 3.87–5.11)
RDW: 13.1 % (ref 11.5–15.5)
WBC: 5.3 10*3/uL (ref 4.0–10.5)

## 2018-04-16 LAB — HEPATIC FUNCTION PANEL
ALT: 14 U/L (ref 0–35)
AST: 20 U/L (ref 0–37)
Albumin: 4.4 g/dL (ref 3.5–5.2)
Alkaline Phosphatase: 45 U/L (ref 39–117)
BILIRUBIN TOTAL: 0.9 mg/dL (ref 0.2–1.2)
Bilirubin, Direct: 0.2 mg/dL (ref 0.0–0.3)
Total Protein: 7.2 g/dL (ref 6.0–8.3)

## 2018-04-16 LAB — URINALYSIS, ROUTINE W REFLEX MICROSCOPIC
Bilirubin Urine: NEGATIVE
Hgb urine dipstick: NEGATIVE
Leukocytes, UA: NEGATIVE
Nitrite: NEGATIVE
Specific Gravity, Urine: 1.015 (ref 1.000–1.030)
Total Protein, Urine: NEGATIVE
URINE GLUCOSE: NEGATIVE
UROBILINOGEN UA: 0.2 (ref 0.0–1.0)
pH: 6 (ref 5.0–8.0)

## 2018-04-16 LAB — LIPASE: Lipase: 19 U/L (ref 11.0–59.0)

## 2018-04-16 LAB — BASIC METABOLIC PANEL
BUN: 18 mg/dL (ref 6–23)
CALCIUM: 9.5 mg/dL (ref 8.4–10.5)
CO2: 28 meq/L (ref 19–32)
CREATININE: 0.65 mg/dL (ref 0.40–1.20)
Chloride: 103 mEq/L (ref 96–112)
GFR: 93.21 mL/min (ref >60.00)
GLUCOSE: 104 mg/dL — AB (ref 70–99)
POTASSIUM: 3.6 meq/L (ref 3.5–5.1)
SODIUM: 140 meq/L (ref 135–145)

## 2018-04-16 MED ORDER — METRONIDAZOLE 250 MG PO TABS: 250.0000 mg | ORAL_TABLET | Freq: Three times a day (TID) | ORAL | 0 refills | Status: AC

## 2018-04-16 MED ORDER — CIPROFLOXACIN HCL 250 MG PO TABS: 250.0000 mg | ORAL_TABLET | Freq: Two times a day (BID) | ORAL | 0 refills | Status: AC

## 2018-04-16 NOTE — Patient Instructions (Signed)
Please take all new medication as prescribed - the cipro and flagyl antibiotics  Please continue all other medications as before, and refills have been done if requested.  Please have the pharmacy call with any other refills you may need.  Please keep your appointments with your specialists as you may have planned  Please go to the LAB in the Basement (turn left off the elevator) for the tests to be done today  You will be contacted by phone if any changes need to be made immediately.  Otherwise, you will receive a letter about your results with an explanation, but please check with MyChart first.  Please remember to sign up for MyChart if you have not done so, as this will be important to you in the future with finding out test results, communicating by private email, and scheduling acute appointments online when needed.

## 2018-04-16 NOTE — Progress Notes (Signed)
Subjective:    Patient ID: Kelli Nielsen, female    DOB: 08-19-1936, 82 y.o.   MRN: [id number]  HPI  Here to f/u with acute diarrhea, started suddenly 1 pm yesterday with generalized abd discomfort, mild to mod crampy pain and "noisy all over" that persists and feels some warm, helped by pepto bismol, nothing seems to make worse; denies high fever, chills, n/v but did have noted an episode of small volume blood on tissue where she thinks she had wiped too vigourously.  No melena or other BRBPR.  BP elevated today but states BP normally at home < 140/90.  Pt denies chest pain, increased sob or doe, wheezing, orthopnea, PND, increased LE swelling, palpitations, dizziness or syncope. Past Medical History:  Diagnosis Date  . Adenomatous colon polyp   . Anxiety    Past Surgical History:  Procedure Laterality Date  . APPENDECTOMY  1959   peritonitis  . BREAST BIOPSY     benign  . COLONOSCOPY  2014   negative; Dr Deatra Ina  . TONSILLECTOMY  1959    reports that she has never smoked. She has never used smokeless tobacco. She reports that she drinks about 1.2 oz of alcohol per week. She reports that she does not use drugs. family history includes Heart disease in her mother. Allergies  Allergen Reactions  . Azithromycin     REACTION: elevated liver enzymes  . Penicillins Hives    Hives    Current Outpatient Medications on File Prior to Visit  Medication Sig Dispense Refill  . ALPRAZolam (XANAX) 0.25 MG tablet TAKE ONE TABLET BY MOUTH AT BEDTIME AS NEEDED FOR ANXIETY 30 tablet 5  . aspirin 81 MG EC tablet Take 1 tablet (81 mg total) by mouth daily. Swallow whole. 30 tablet 12  . benzonatate (TESSALON) 100 MG capsule Take 1-2 capsules (100-200 mg total) by mouth 3 (three) times daily as needed for cough. 40 capsule 0  . citalopram (CELEXA) 10 MG tablet Take 1 tablet (10 mg total) by mouth daily. 90 tablet 3  . Guaifenesin (MUCINEX MAXIMUM STRENGTH) 1200 MG TB12 Take 1 tablet (1,200 mg total)  by mouth every 12 (twelve) hours as needed. 14 tablet 1  . ipratropium (ATROVENT) 0.03 % nasal spray Place 2 sprays into both nostrils 2 (two) times daily. 30 mL 0  . Probiotic Product (ALIGN) 4 MG CAPS Take 1 capsule by mouth daily.       No current facility-administered medications on file prior to visit.    Review of Systems  Constitutional: Negative for other unusual diaphoresis or sweats HENT: Negative for ear discharge or swelling Eyes: Negative for other worsening visual disturbances Respiratory: Negative for stridor or other swelling  Gastrointestinal: Negative for worsening distension or other blood Genitourinary: Negative for retention or other urinary change Musculoskeletal: Negative for other MSK pain or swelling Skin: Negative for color change or other new lesions Neurological: Negative for worsening tremors and other numbness  Psychiatric/Behavioral: Negative for worsening agitation or other fatigue All other system neg per pt    Objective:   Physical Exam BP (!) 160/60 (BP Location: Left Arm, Patient Position: Sitting, Cuff Size: Normal)   Pulse 89   Temp 98.1 F (36.7 C) (Oral)   Ht 5\' 8"  (1.727 m)   Wt 121 lb 8 oz (55.1 kg)   SpO2 98%   BMI 18.47 kg/m  VS noted, mild ill Constitutional: Pt appears in NAD HENT: Head: NCAT.  Right Ear: External ear normal.  Left Ear: External ear normal.  Eyes: . Pupils are equal, round, and reactive to light. Conjunctivae and EOM are normal Nose: without d/c or deformity Neck: Neck supple. Gross normal ROM Cardiovascular: Normal rate and regular rhythm.   Pulmonary/Chest: Effort normal and breath sounds without rales or wheezing.  Abd:  Soft, diffuse mild general tender without guarding or rebound, ND, + BS, no organomegaly Neurological: Pt is alert. At baseline orientation, motor grossly intact Skin: Skin is warm. No rashes, other new lesions, no LE edema Psychiatric: Pt behavior is normal without agitation  No other exam  findings     Assessment & Plan:

## 2018-04-17 NOTE — Assessment & Plan Note (Signed)
Acute onset, likely infectious, c/w likely colitis - for empiric cipro/flagyl, and labs as ordered including GI panel

## 2018-04-17 NOTE — Assessment & Plan Note (Signed)
Also for labs as ordered,  to f/u any worsening symptoms or concerns 

## 2018-04-17 NOTE — Assessment & Plan Note (Signed)
Small volume, declines GI referral

## 2018-04-20 ENCOUNTER — Other Ambulatory Visit: Payer: Medicare Other

## 2018-04-20 DIAGNOSIS — K921 Melena: Secondary | ICD-10-CM | POA: Diagnosis not present

## 2018-04-20 DIAGNOSIS — R1084 Generalized abdominal pain: Secondary | ICD-10-CM | POA: Diagnosis not present

## 2018-04-20 DIAGNOSIS — R197 Diarrhea, unspecified: Secondary | ICD-10-CM | POA: Diagnosis not present

## 2018-04-21 ENCOUNTER — Other Ambulatory Visit: Payer: Medicare Other

## 2018-04-21 LAB — TIQ-NTM

## 2018-04-22 ENCOUNTER — Encounter: Payer: Self-pay | Admitting: Internal Medicine

## 2018-04-23 ENCOUNTER — Telehealth: Payer: Self-pay

## 2018-04-23 ENCOUNTER — Encounter: Payer: Self-pay | Admitting: Internal Medicine

## 2018-04-23 ENCOUNTER — Other Ambulatory Visit (INDEPENDENT_AMBULATORY_CARE_PROVIDER_SITE_OTHER): Payer: Medicare Other

## 2018-04-23 ENCOUNTER — Other Ambulatory Visit: Payer: Self-pay | Admitting: Internal Medicine

## 2018-04-23 DIAGNOSIS — R1084 Generalized abdominal pain: Secondary | ICD-10-CM

## 2018-04-23 DIAGNOSIS — R197 Diarrhea, unspecified: Secondary | ICD-10-CM

## 2018-04-23 DIAGNOSIS — K921 Melena: Secondary | ICD-10-CM

## 2018-04-23 LAB — FECAL OCCULT BLOOD, IMMUNOCHEMICAL: FECAL OCCULT BLD: POSITIVE — AB

## 2018-04-23 NOTE — Telephone Encounter (Signed)
Pt has been informed and expressed understanding.  

## 2018-04-23 NOTE — Telephone Encounter (Signed)
Oh sure, this would be fine, no trouble with this, thanks

## 2018-04-23 NOTE — Telephone Encounter (Signed)
Pt has been informed.

## 2018-04-23 NOTE — Telephone Encounter (Signed)
-----   Message from Biagio Borg, MD sent at 04/23/2018 12:24 PM EDT ----- Letter sent, cont same tx except  The test results show that your current treatment is OK, except the test above was positive for blood in the stool. The reason is not clear,  Please allow [place] to refer to Gastroenterology for further consideration, as your other stools tests have been negative. Your last colonoscopy was 2012 with Dr Deatra Ina.  I will do the referral, and you should hear from the office as well  .    Kelli Nielsen to please inform pt, I will do referral

## 2018-04-23 NOTE — Telephone Encounter (Signed)
Copied from Marissa [phone]. Topic: General - Other >> Apr 23, 2018  9:23 AM Carolyn Stare wrote:  Pt call and she would to know if she can take Align pro biotic. She was told she could not take tums or anything while on a antibiotic   [place]

## 2018-04-24 LAB — STOOL CULTURE
MICRO NUMBER: 90640361
MICRO NUMBER: 90640362
MICRO NUMBER:: 90640363
SHIGA RESULT: NOT DETECTED
SPECIMEN QUALITY: ADEQUATE
SPECIMEN QUALITY: ADEQUATE
SPECIMEN QUALITY:: ADEQUATE

## 2018-04-24 LAB — GASTROINTESTINAL PATHOGEN PANEL PCR
C. DIFFICILE TOX A/B, PCR: NOT DETECTED
CAMPYLOBACTER, PCR: NOT DETECTED
Cryptosporidium, PCR: NOT DETECTED
E coli (ETEC) LT/ST PCR: NOT DETECTED
E coli (STEC) stx1/stx2, PCR: NOT DETECTED
E coli 0157, PCR: NOT DETECTED
Giardia lamblia, PCR: NOT DETECTED
NOROVIRUS, PCR: NOT DETECTED
ROTAVIRUS, PCR: NOT DETECTED
SALMONELLA, PCR: NOT DETECTED
Shigella, PCR: NOT DETECTED

## 2018-04-26 ENCOUNTER — Encounter: Payer: Self-pay | Admitting: Gastroenterology

## 2018-05-18 ENCOUNTER — Ambulatory Visit: Payer: Medicare Other | Admitting: Gastroenterology

## 2018-07-19 ENCOUNTER — Other Ambulatory Visit: Payer: Self-pay | Admitting: Internal Medicine

## 2018-07-20 NOTE — Telephone Encounter (Signed)
Done erx 

## 2018-07-21 ENCOUNTER — Ambulatory Visit: Payer: Medicare Other | Admitting: Gastroenterology

## 2018-08-11 ENCOUNTER — Ambulatory Visit (INDEPENDENT_AMBULATORY_CARE_PROVIDER_SITE_OTHER): Payer: Medicare Other

## 2018-08-11 DIAGNOSIS — Z23 Encounter for immunization: Secondary | ICD-10-CM

## 2019-01-18 ENCOUNTER — Ambulatory Visit: Payer: Self-pay

## 2019-01-18 NOTE — Telephone Encounter (Signed)
Pt c/o urinary urgency and frequency and burning with urination. Symptoms started today. Care advice given and pt verbalized understanding. Pt asked if there was anything OTC. Advised Azo and drinking cranberry juice Pt to see Caesar Chestnut NP tomorrow morning.  Reason for Disposition . Urinating more frequently than usual (i.e., frequency)  Answer Assessment - Initial Assessment Questions 1. SYMPTOM: "What's the main symptom you're concerned about?" (e.g., frequency, incontinence)     Urgency, frequency 2. ONSET: "When did the  Symptoms   start?"    today 3. PAIN: "Is there any pain?" If so, ask: "How bad is it?" (Scale: 1-10; mild, moderate, severe)     Mild pain and burning 4. CAUSE: "What do you think is causing the symptoms?"     UTI 5. OTHER SYMPTOMS: "Do you have any other symptoms?" (e.g., fever, flank pain, blood in urine, pain with urination)     Pain with urination 6. PREGNANCY: "Is there any chance you are pregnant?" "When was your last menstrual period?"     n/a  Protocols used: URINARY [facility]

## 2019-01-19 ENCOUNTER — Encounter: Payer: Self-pay | Admitting: Nurse Practitioner

## 2019-01-19 ENCOUNTER — Other Ambulatory Visit: Payer: Medicare Other

## 2019-01-19 ENCOUNTER — Ambulatory Visit: Payer: Medicare Other | Admitting: Nurse Practitioner

## 2019-01-19 VITALS — BP 150/70 | HR 96 | Ht 68.0 in | Wt 126.0 lb

## 2019-01-19 DIAGNOSIS — R300 Dysuria: Secondary | ICD-10-CM

## 2019-01-19 LAB — POCT URINALYSIS DIPSTICK
BILIRUBIN UA: NEGATIVE
GLUCOSE UA: NEGATIVE
Ketones, UA: NEGATIVE
Leukocytes, UA: NEGATIVE
NITRITE UA: NEGATIVE
Protein, UA: NEGATIVE
RBC UA: NEGATIVE
SPEC GRAV UA: 1.02 (ref 1.010–1.025)
UROBILINOGEN UA: 0.2 U/dL
pH, UA: 6 (ref 5.0–8.0)

## 2019-01-19 MED ORDER — NITROFURANTOIN MONOHYD MACRO 100 MG PO CAPS: 100.0000 mg | ORAL_CAPSULE | Freq: Two times a day (BID) | ORAL | 0 refills | Status: DC

## 2019-01-19 NOTE — Patient Instructions (Signed)
Start antibiotic as discussed  I will let you know when the urine culture returns    Dysuria Dysuria is pain or discomfort while urinating. The pain or discomfort may be felt in the part of your body that drains urine from the bladder (urethra) or in the surrounding tissue of the genitals. The pain may also be felt in the groin area, lower abdomen, or lower back. You may have to urinate frequently or have the sudden feeling that you have to urinate (urgency). Dysuria can affect both men and women, but it is more common in women. Dysuria can be caused by many different things, including:  Urinary tract infection.  Kidney stones or bladder stones.  Certain sexually transmitted infections (STIs), such as chlamydia.  Dehydration.  Inflammation of the tissues of the vagina.  Use of certain medicines.  Use of certain soaps or scented products that cause irritation. Follow these instructions at home: General instructions  Watch your condition for any changes.  Urinate often. Avoid holding urine for long periods of time.  After a bowel movement or urination, women should cleanse from front to back, using each tissue only once.  Urinate after sexual intercourse.  Keep all follow-up visits as told by your health care provider. This is important.  If you had any tests done to find the cause of dysuria, it is up to you to get your test results. Ask your health care provider, or the department that is doing the test, when your results will be ready. Eating and drinking   Drink enough fluid to keep your urine pale yellow.  Avoid caffeine, tea, and alcohol. They can irritate the bladder and make dysuria worse. In men, alcohol may irritate the prostate. Medicines  Take over-the-counter and prescription medicines only as told by your health care provider.  If you were prescribed an antibiotic medicine, take it as told by your health care provider. Do not stop taking the antibiotic even  if you start to feel better. Contact a health care provider if:  You have a fever.  You develop pain in your back or sides.  You have nausea or vomiting.  You have blood in your urine.  You are not urinating as often as you usually do. Get help right away if:  Your pain is severe and not relieved with medicines.  You cannot eat or drink without vomiting.  You are confused.  You have a rapid heartbeat while at rest.  You have shaking or chills.  You feel extremely weak. Summary  Dysuria is pain or discomfort while urinating. Many different conditions can lead to dysuria.  If you have dysuria, you may have to urinate frequently or have the sudden feeling that you have to urinate (urgency).  Watch your condition for any changes. Keep all follow-up visits as told by your health care provider.  Make sure that you urinate often and drink enough fluid to keep your urine pale yellow. This information is not intended to replace advice given to you by your health care provider. Make sure you discuss any questions you have with your health care provider. Document Released: 08/08/2004 Document Revised: 08/27/2017 Document Reviewed: 08/27/2017 Elsevier Interactive Patient Education  2019 [employer].

## 2019-01-19 NOTE — Progress Notes (Signed)
Kelli Nielsen is a 83 y.o. female with the following history as recorded in EpicCare:  Patient Active Problem List   Diagnosis Date Noted  . Diarrhea 04/16/2018  . Abdominal pain 04/16/2018  . Insomnia 11/18/2017  . Hyperglycemia 11/18/2017  . Elevated MCV 11/18/2017  . Sciatica of left side 09/23/2017  . Dysuria 07/17/2016  . Encounter for well adult exam with abnormal findings 03/27/2016  . Medicare annual wellness visit, subsequent 03/27/2016  . Cough 11/07/2014  . Depression with anxiety 05/05/2014  . Mixed hyperlipidemia 05/05/2014  . Elevated blood pressure reading without diagnosis of hypertension 05/05/2014  . Vaginitis and vulvovaginitis 05/19/2013  . Hypopotassemia 10/17/2008  . HEMATOCHEZIA 10/07/2008  . DISORDER, MITRAL VALVE 06/17/2007    Current Outpatient Medications  Medication Sig Dispense Refill  . ALPRAZolam (XANAX) 0.25 MG tablet TAKE 1 TABLET BY MOUTH ONCE DAILY AT BEDTIME AS NEEDED FOR ANXIETY 30 tablet 5  . phenazopyridine (PYRIDIUM) 97 MG tablet Take 97 mg by mouth 3 (three) times daily as needed for pain.    . Probiotic Product (ALIGN) 4 MG CAPS Take 1 capsule by mouth daily.      . citalopram (CELEXA) 10 MG tablet Take 1 tablet (10 mg total) by mouth daily. 90 tablet 3  . nitrofurantoin, macrocrystal-monohydrate, (MACROBID) 100 MG capsule Take 1 capsule (100 mg total) by mouth 2 (two) times daily. 10 capsule 0   No current facility-administered medications for this visit.     Allergies: Azithromycin and Penicillins  Past Medical History:  Diagnosis Date  . Adenomatous colon polyp   . Anxiety     Past Surgical History:  Procedure Laterality Date  . APPENDECTOMY  1959   peritonitis  . BREAST BIOPSY     benign  . COLONOSCOPY  2014   negative; Dr Deatra Ina  . TONSILLECTOMY  1959    Family History  Problem Relation Age of Onset  . Heart disease Mother        ? MI  . Diabetes Neg Hx   . Cancer Neg Hx   . Stroke Neg Hx     Social History    Tobacco Use  . Smoking status: Never Smoker  . Smokeless tobacco: Never Used  Substance Use Topics  . Alcohol use: Yes    Alcohol/week: 2.0 standard drinks    Types: 2 Glasses of wine per week     Subjective:  [name] is here today for acute visit, CC: urinary symptoms Reports: Dysuria and urinary frequency x 1 day Denies: fevers, chills, abdominal pain, nausea, vomiting, back pain, hematuria, vaginal discharge or bleeding  Feels like when she had a UTI in the past Symptoms feel worse today than yesterday  Tried at home: pyridium with temporary relief  ROS- See HPI  Objective:  Vitals:   01/19/19 0816  BP: (!) 150/70  Pulse: 96  SpO2: 98%  Weight: 126 lb (57.2 kg)  Height: 5\' 8"  (1.727 m)    General: Well developed, well nourished, in no acute distress  Skin : Warm and dry.  Head: Normocephalic and atraumatic  Eyes: Sclera and conjunctiva clear; pupils round and reactive to light; extraocular movements intact  Oropharynx: Pink, supple. No suspicious lesions  Neck: Supple Lungs: Effort unlabored, no respiratory distress CVS exam: normal rate and regular rhythm.  Abdomen: Soft; nontender; nondistended; no masses or hepatosplenomegaly  Extremities: No edema, cyanosis, clubbing  Vessels: Symmetric bilaterally  Neurologic: Alert and oriented; speech intact; face symmetrical; moves all extremities well; CNII-XII intact  without focal deficit  Psychiatric: Normal mood and affect.    Assessment:  1. Dysuria     Plan:   POCT urine testing does not indicate infection today, however she is adamantly requesting antibiotic Rx  Urine culture sent, will go ahead and start antibiotic course for her while awaiting culture results, however will have her stop antibiotic if culture is negative--medication dosing, side effects discussed Home management, red flags and return precautions including when to seek immediate care discussed and printed on AVS  No follow-ups on file.   Orders Placed This Encounter  Procedures  . Urine Culture    Standing Status:   Future    Standing Expiration Date:   02/17/2019  . POCT urinalysis dipstick    Requested Prescriptions   Signed Prescriptions Disp Refills  . nitrofurantoin, macrocrystal-monohydrate, (MACROBID) 100 MG capsule 10 capsule 0    Sig: Take 1 capsule (100 mg total) by mouth 2 (two) times daily.

## 2019-01-20 LAB — URINE CULTURE
MICRO NUMBER:: 245360
SPECIMEN QUALITY:: ADEQUATE

## 2019-02-08 DIAGNOSIS — H524 Presbyopia: Secondary | ICD-10-CM | POA: Diagnosis not present

## 2019-02-12 ENCOUNTER — Other Ambulatory Visit: Payer: Self-pay | Admitting: Internal Medicine

## 2019-02-14 ENCOUNTER — Other Ambulatory Visit: Payer: Self-pay | Admitting: Internal Medicine

## 2019-02-14 NOTE — Telephone Encounter (Signed)
Done erx  Please let pt know, needs OV for further refills after these

## 2019-03-31 ENCOUNTER — Ambulatory Visit (INDEPENDENT_AMBULATORY_CARE_PROVIDER_SITE_OTHER): Payer: Medicare Other | Admitting: Internal Medicine

## 2019-03-31 ENCOUNTER — Encounter: Payer: Self-pay | Admitting: Internal Medicine

## 2019-03-31 DIAGNOSIS — R739 Hyperglycemia, unspecified: Secondary | ICD-10-CM

## 2019-03-31 DIAGNOSIS — F418 Other specified anxiety disorders: Secondary | ICD-10-CM

## 2019-03-31 MED ORDER — CITALOPRAM HYDROBROMIDE 10 MG PO TABS: 10.0000 mg | ORAL_TABLET | Freq: Every day | ORAL | 3 refills | Status: DC

## 2019-03-31 NOTE — Patient Instructions (Signed)
Please take all new medication as prescribed - the celexa 10 mg per day  Please call Hospice tomorrow to inquire about when they would be returning  Please continue all other medications as before, and refills have been done if requested.  Please have the pharmacy call with any other refills you may need.  Please keep your appointments with your specialists as you may have planned

## 2019-03-31 NOTE — Assessment & Plan Note (Signed)
For celexa 10 qd 

## 2019-03-31 NOTE — Assessment & Plan Note (Signed)
stable overall by history and exam, recent data reviewed with pt, and pt to continue medical treatment as before,  to f/u any worsening symptoms or concerns  

## 2019-03-31 NOTE — Progress Notes (Signed)
Patient ID: Kelli Nielsen, female   DOB: Jul 17, 1936, 83 y.o.   MRN: [id number]  Cumulative time during 7-day interval 12 min, there was not an associated office visit for this concern within a 7 day period . Verbal consent for services obtained from patient prior to services given.  Names of all persons present for services: [name], MD, patient  Chief complaint: anxiety  History:   Here with c/o markedly increased stress and anxiety as her husband is with hospice who had to stop coming to the home due to the pandemic restrictions.  She is at her wits end, Denies worsening depressive symptoms, suicidal ideation, or panic.  Very hard to cope.  Is asking for daily non addictive med for anxiety in addition to the xanax she tries to minimize.  Pt denies chest pain, increased sob or doe, wheezing, orthopnea, PND, increased LE swelling, palpitations, dizziness or syncope.   Pt denies polydipsia, polyuria, Past Medical History:  Diagnosis Date  . Adenomatous colon polyp   . Anxiety    Past Surgical History:  Procedure Laterality Date  . APPENDECTOMY  1959   peritonitis  . BREAST BIOPSY     benign  . COLONOSCOPY  2014   negative; Dr Deatra Ina  . TONSILLECTOMY  1959    reports that she has never smoked. She has never used smokeless tobacco. She reports current alcohol use of about 2.0 standard drinks of alcohol per week. She reports that she does not use drugs. family history includes Heart disease in her mother. Allergies  Allergen Reactions  . Azithromycin     REACTION: elevated liver enzymes  . Penicillins Hives    Hives    Current Outpatient Medications on File Prior to Visit  Medication Sig Dispense Refill  . ALPRAZolam (XANAX) 0.25 MG tablet TAKE 1 TABLET BY MOUTH ONCE DAILY AT BEDTIME AS NEEDED FOR ANXIETY 30 tablet 2  . nitrofurantoin, macrocrystal-monohydrate, (MACROBID) 100 MG capsule Take 1 capsule (100 mg total) by mouth 2 (two) times daily. 10 capsule 0  . phenazopyridine  (PYRIDIUM) 97 MG tablet Take 97 mg by mouth 3 (three) times daily as needed for pain.    . Probiotic Product (ALIGN) 4 MG CAPS Take 1 capsule by mouth daily.       No current facility-administered medications on file prior to visit.    Lab Results  Component Value Date   WBC 5.3 04/16/2018   HGB 13.7 04/16/2018   HCT 40.9 04/16/2018   PLT 217.0 04/16/2018   GLUCOSE 104 (H) 04/16/2018   CHOL 152 11/18/2017   TRIG 81.0 11/18/2017   HDL 69.70 11/18/2017   LDLCALC 66 11/18/2017   ALT 14 04/16/2018   AST 20 04/16/2018   NA 140 04/16/2018   K 3.6 04/16/2018   CL 103 04/16/2018   CREATININE 0.65 04/16/2018   BUN 18 04/16/2018   CO2 28 04/16/2018   TSH 1.31 11/18/2017   HGBA1C 5.7 11/18/2017    A/P/next steps:   Anxiety - ok for addon celexa 10 mg dialy, cont prn xanax, since tomorrow is start of phase I lockdown loosening restrictions, I asked her to call tomorrow to see when hospice may be returning, and take advantage of counseling as well.  [name] MD

## 2019-04-07 ENCOUNTER — Telehealth: Payer: Self-pay | Admitting: Internal Medicine

## 2019-04-07 ENCOUNTER — Telehealth: Payer: Self-pay

## 2019-04-07 ENCOUNTER — Ambulatory Visit: Payer: Self-pay | Admitting: *Deleted

## 2019-04-07 NOTE — Telephone Encounter (Signed)
Copied from [place] [id number]. Topic: General - Inquiry >> Apr 07, 2019  9:54 AM Percell Belt A wrote: Reason for CRM: pt called in and has a few questions about the citalopram (CELEXA) 10 MG tablet [[id number]].   She feels like her face is puffy and a little off balance. Little sleepy.  She is also having very loss stools.  She would like to talk to nurse to see if this is normal with this med   Best number  [phone]

## 2019-04-07 NOTE — Telephone Encounter (Signed)
Please advise 

## 2019-04-07 NOTE — Telephone Encounter (Signed)
Sorry, I dont see a message to address. thanks

## 2019-04-07 NOTE — Telephone Encounter (Signed)
Pt called with c/o puffiness in her face, being off balanced and feeling tired and some diarrhea after starting on the medication citalopram. Has been on since May 9th. She did not take the dose for today and will wait to hear from the provider before taking the next dose of medication. She even tried taking it at night to see if it mattered and did not see any changes to her symptoms. She needs to have a medication to help her mentally because she is taking care of her husband and now hospice is helping. She would like a call back in the morning regarding this medication. Routing to flow at [facility] for review and call back.  Reason for Disposition . Caller has NON-URGENT medication question about med that PCP prescribed and triager unable to answer question  Answer Assessment - Initial Assessment Questions 1. SYMPTOMS: "Do you have any symptoms?"    yes 2. SEVERITY: If symptoms are present, ask "Are they mild, moderate or severe?"      Mild to moderate  Protocols used: MEDICATION QUESTION CALL-A-AH

## 2019-04-08 MED ORDER — VENLAFAXINE HCL ER 150 MG PO CP24: 150.0000 mg | ORAL_CAPSULE | Freq: Every day | ORAL | 3 refills | Status: DC

## 2019-04-08 NOTE — Telephone Encounter (Signed)
Hard to know if this really is the cause as could be allergy to anything else; but will d/c celexa , ok to change to effexor xr 150 which is a different kind of medication  OK to take benadryl 50 mg every 6 hrs as needed for the swelling and watch for sleepiness  Pt to go to ER for worsening swelling and sob.

## 2019-04-08 NOTE — Telephone Encounter (Signed)
Pt has been informed and expressed understanding.  

## 2019-04-08 NOTE — Telephone Encounter (Signed)
DUPLICATE MESSAGE. FIRST MSG HAS BEEN SENT TO PCP.

## 2019-04-08 NOTE — Addendum Note (Signed)
Addended by: Biagio Borg on: 04/08/2019 09:00 AM   Modules accepted: Orders

## 2019-07-04 DIAGNOSIS — H5203 Hypermetropia, bilateral: Secondary | ICD-10-CM | POA: Diagnosis not present

## 2019-07-30 ENCOUNTER — Other Ambulatory Visit: Payer: Self-pay

## 2019-07-30 ENCOUNTER — Ambulatory Visit (INDEPENDENT_AMBULATORY_CARE_PROVIDER_SITE_OTHER): Payer: Medicare Other

## 2019-07-30 DIAGNOSIS — Z23 Encounter for immunization: Secondary | ICD-10-CM

## 2019-08-26 ENCOUNTER — Other Ambulatory Visit: Payer: Self-pay | Admitting: Internal Medicine

## 2019-08-26 ENCOUNTER — Other Ambulatory Visit (INDEPENDENT_AMBULATORY_CARE_PROVIDER_SITE_OTHER): Payer: Medicare Other

## 2019-08-26 ENCOUNTER — Encounter: Payer: Self-pay | Admitting: Internal Medicine

## 2019-08-26 ENCOUNTER — Ambulatory Visit (INDEPENDENT_AMBULATORY_CARE_PROVIDER_SITE_OTHER): Payer: Medicare Other | Admitting: Internal Medicine

## 2019-08-26 ENCOUNTER — Other Ambulatory Visit: Payer: Self-pay

## 2019-08-26 VITALS — BP 124/78 | HR 90 | Temp 98.5°F | Ht 68.0 in | Wt 123.0 lb

## 2019-08-26 DIAGNOSIS — Z0001 Encounter for general adult medical examination with abnormal findings: Secondary | ICD-10-CM

## 2019-08-26 DIAGNOSIS — R030 Elevated blood-pressure reading, without diagnosis of hypertension: Secondary | ICD-10-CM

## 2019-08-26 DIAGNOSIS — E538 Deficiency of other specified B group vitamins: Secondary | ICD-10-CM | POA: Diagnosis not present

## 2019-08-26 DIAGNOSIS — F418 Other specified anxiety disorders: Secondary | ICD-10-CM | POA: Diagnosis not present

## 2019-08-26 DIAGNOSIS — E559 Vitamin D deficiency, unspecified: Secondary | ICD-10-CM | POA: Diagnosis not present

## 2019-08-26 DIAGNOSIS — Z0000 Encounter for general adult medical examination without abnormal findings: Secondary | ICD-10-CM | POA: Diagnosis not present

## 2019-08-26 DIAGNOSIS — E611 Iron deficiency: Secondary | ICD-10-CM

## 2019-08-26 DIAGNOSIS — G4700 Insomnia, unspecified: Secondary | ICD-10-CM

## 2019-08-26 DIAGNOSIS — R739 Hyperglycemia, unspecified: Secondary | ICD-10-CM

## 2019-08-26 DIAGNOSIS — E782 Mixed hyperlipidemia: Secondary | ICD-10-CM

## 2019-08-26 LAB — HEPATIC FUNCTION PANEL
ALT: 15 U/L (ref 0–35)
AST: 23 U/L (ref 0–37)
Albumin: 4.7 g/dL (ref 3.5–5.2)
Alkaline Phosphatase: 51 U/L (ref 39–117)
Bilirubin, Direct: 0.1 mg/dL (ref 0.0–0.3)
Total Bilirubin: 0.7 mg/dL (ref 0.2–1.2)
Total Protein: 7.7 g/dL (ref 6.0–8.3)

## 2019-08-26 LAB — TSH: TSH: 0.76 u[IU]/mL (ref 0.35–4.50)

## 2019-08-26 LAB — URINALYSIS, ROUTINE W REFLEX MICROSCOPIC
Bilirubin Urine: NEGATIVE
Hgb urine dipstick: NEGATIVE
Ketones, ur: NEGATIVE
Nitrite: NEGATIVE
RBC / HPF: NONE SEEN (ref 0–?)
Specific Gravity, Urine: 1.02 (ref 1.000–1.030)
Total Protein, Urine: NEGATIVE
Urine Glucose: NEGATIVE
Urobilinogen, UA: 0.2 (ref 0.0–1.0)
pH: 5.5 (ref 5.0–8.0)

## 2019-08-26 LAB — CBC WITH DIFFERENTIAL/PLATELET
Basophils Absolute: 0 10*3/uL (ref 0.0–0.1)
Basophils Relative: 0.5 % (ref 0.0–3.0)
Eosinophils Absolute: 0 10*3/uL (ref 0.0–0.7)
Eosinophils Relative: 0.6 % (ref 0.0–5.0)
HCT: 42.4 % (ref 36.0–46.0)
Hemoglobin: 14.3 g/dL (ref 12.0–15.0)
Lymphocytes Relative: 21.4 % (ref 12.0–46.0)
Lymphs Abs: 1.2 10*3/uL (ref 0.7–4.0)
MCHC: 33.8 g/dL (ref 30.0–36.0)
MCV: 102.8 fl — ABNORMAL HIGH (ref 78.0–100.0)
Monocytes Absolute: 0.5 10*3/uL (ref 0.1–1.0)
Monocytes Relative: 9 % (ref 3.0–12.0)
Neutro Abs: 3.7 10*3/uL (ref 1.4–7.7)
Neutrophils Relative %: 68.5 % (ref 43.0–77.0)
Platelets: 236 10*3/uL (ref 150.0–400.0)
RBC: 4.12 Mil/uL (ref 3.87–5.11)
RDW: 12.8 % (ref 11.5–15.5)
WBC: 5.5 10*3/uL (ref 4.0–10.5)

## 2019-08-26 LAB — LIPID PANEL
Cholesterol: 186 mg/dL (ref 0–200)
HDL: 95.3 mg/dL (ref >39.00)
LDL Cholesterol: 73 mg/dL (ref 0–99)
NonHDL: 90.34
Total CHOL/HDL Ratio: 2
Triglycerides: 86 mg/dL (ref 0.0–149.0)
VLDL: 17.2 mg/dL (ref 0.0–40.0)

## 2019-08-26 LAB — BASIC METABOLIC PANEL
BUN: 21 mg/dL (ref 6–23)
CO2: 27 mEq/L (ref 19–32)
Calcium: 10 mg/dL (ref 8.4–10.5)
Chloride: 104 mEq/L (ref 96–112)
Creatinine, Ser: 0.65 mg/dL (ref 0.40–1.20)
GFR: 87.4 mL/min (ref >60.00)
Glucose, Bld: 96 mg/dL (ref 70–99)
Potassium: 4.5 mEq/L (ref 3.5–5.1)
Sodium: 140 mEq/L (ref 135–145)

## 2019-08-26 LAB — IBC PANEL
Iron: 116 ug/dL (ref 42–145)
Saturation Ratios: 35.1 % (ref 20.0–50.0)
Transferrin: 236 mg/dL (ref 212.0–360.0)

## 2019-08-26 LAB — VITAMIN B12: Vitamin B-12: 441 pg/mL (ref 211–911)

## 2019-08-26 LAB — VITAMIN D 25 HYDROXY (VIT D DEFICIENCY, FRACTURES): VITD: 28.72 ng/mL — ABNORMAL LOW (ref 30.00–100.00)

## 2019-08-26 MED ORDER — ESCITALOPRAM OXALATE 10 MG PO TABS: 10.0000 mg | ORAL_TABLET | Freq: Every day | ORAL | 11 refills | Status: DC

## 2019-08-26 MED ORDER — ALPRAZOLAM 0.25 MG PO TABS: ORAL_TABLET | ORAL | 2 refills | Status: DC

## 2019-08-26 MED ORDER — VITAMIN D (ERGOCALCIFEROL) 1.25 MG (50000 UNIT) PO CAPS: 50000.0000 [IU] | ORAL_CAPSULE | ORAL | 0 refills | Status: DC

## 2019-08-26 NOTE — Progress Notes (Signed)
Subjective:    Patient ID: Kelli Nielsen, female    DOB: [birth year], 83 y.o.   MRN: [id number]  HPI  Here for wellness and f/u;  Overall doing ok;  Pt denies Chest pain, worsening SOB, DOE, wheezing, orthopnea, PND, worsening LE edema, palpitations, dizziness or syncope.  Pt denies neurological change such as new headache, facial or extremity weakness.  Pt denies polydipsia, polyuria, or low sugar symptoms. Pt states overall good compliance with treatment and medications, good tolerability, and has been trying to follow appropriate diet.  Pt denies worsening depressive symptoms, suicidal ideation or panic. No fever, night sweats, wt loss, loss of appetite, or other constitutional symptoms.  Pt states good ability with ADL's, has low fall risk, home safety reviewed and adequate, no other significant changes in hearing or vision, and only occasionally active with exercise.   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  Hospice is now involved.  Xanax has worked ok for sleep in past, also working 40 hrs per wk at the [employer], doesn't want to quit as this is her only time away from the caretaking.   Could not tolerate effexor and celexa but hard to say why.   Past Medical History:  Diagnosis Date  . Adenomatous colon polyp   . Anxiety    Past Surgical History:  Procedure Laterality Date  . APPENDECTOMY  1959   peritonitis  . BREAST BIOPSY     benign  . COLONOSCOPY  2014   negative; Dr Deatra Ina  . TONSILLECTOMY  1959    reports that she has never smoked. She has never used smokeless tobacco. She reports current alcohol use of about 2.0 standard drinks of alcohol per week. She reports that she does not use drugs. family history includes Heart disease in her mother. Allergies  Allergen Reactions  . Azithromycin     REACTION: elevated liver enzymes  . Penicillins Hives    Hives   . Celexa [Citalopram  Hydrobromide]    Current Outpatient Medications on File Prior to Visit  Medication Sig Dispense Refill  . ALPRAZolam (XANAX) 0.25 MG tablet TAKE 1 TABLET BY MOUTH ONCE DAILY AT BEDTIME AS NEEDED FOR ANXIETY 30 tablet 2  . nitrofurantoin, macrocrystal-monohydrate, (MACROBID) 100 MG capsule Take 1 capsule (100 mg total) by mouth 2 (two) times daily. 10 capsule 0  . phenazopyridine (PYRIDIUM) 97 MG tablet Take 97 mg by mouth 3 (three) times daily as needed for pain.    . Probiotic Product (ALIGN) 4 MG CAPS Take 1 capsule by mouth daily.      Marland Kitchen venlafaxine XR (EFFEXOR-XR) 150 MG 24 hr capsule Take 1 capsule (150 mg total) by mouth daily with breakfast. 90 capsule 3   No current facility-administered medications on file prior to visit.    Review of Systems Constitutional: Negative for other unusual diaphoresis, sweats, appetite or weight changes HENT: Negative for other worsening hearing loss, ear pain, facial swelling, mouth sores or neck stiffness.   Eyes: Negative for other worsening pain, redness or other visual disturbance.  Respiratory: Negative for other stridor or swelling Cardiovascular: Negative for other palpitations or other chest pain  Gastrointestinal: Negative for worsening diarrhea or loose stools, blood in stool, distention or other pain Genitourinary: Negative for hematuria, flank pain or other change in urine volume.  Musculoskeletal: Negative for myalgias or other joint swelling.  Skin: Negative for other color change, or  other wound or worsening drainage.  Neurological: Negative for other syncope or numbness. Hematological: Negative for other adenopathy or swelling Psychiatric/Behavioral: Negative for hallucinations, other worsening agitation, SI, self-injury, or new decreased concentration All otherwise neg per pt    Objective:   Physical Exam BP 124/78   Pulse 90   Temp 98.5 F (36.9 C) (Oral)   Ht 5\' 8"  (1.727 m)   Wt 123 lb (55.8 kg)   SpO2 97%   BMI 18.70  kg/m  VS noted,  Constitutional: Pt is oriented to person, place, and time. Appears well-developed and well-nourished, in no significant distress and comfortable Head: Normocephalic and atraumatic  Eyes: Conjunctivae and EOM are normal. Pupils are equal, round, and reactive to light Right Ear: External ear normal without discharge Left Ear: External ear normal without discharge Nose: Nose without discharge or deformity Mouth/Throat: Oropharynx is without other ulcerations and moist  Neck: Normal range of motion. Neck supple. No JVD present. No tracheal deviation present or significant neck LA or mass Cardiovascular: Normal rate, regular rhythm, normal heart sounds and intact distal pulses.   Pulmonary/Chest: WOB normal and breath sounds without rales or wheezing  Abdominal: Soft. Bowel sounds are normal. NT. No HSM  Musculoskeletal: Normal range of motion. Exhibits no edema Lymphadenopathy: Has no other cervical adenopathy.  Neurological: Pt is alert and oriented to person, place, and time. Pt has normal reflexes. No cranial nerve deficit. Motor grossly intact, Gait intact Skin: Skin is warm and dry. No rash noted or new ulcerations Psychiatric:  Has depressed mood and affect. Behavior is normal without agitation All otherwise neg per pt Lab Results  Component Value Date   WBC 5.5 08/26/2019   HGB 14.3 08/26/2019   HCT 42.4 08/26/2019   PLT 236.0 08/26/2019   GLUCOSE 96 08/26/2019   CHOL 186 08/26/2019   TRIG 86.0 08/26/2019   HDL 95.30 08/26/2019   LDLCALC 73 08/26/2019   ALT 15 08/26/2019   AST 23 08/26/2019   NA 140 08/26/2019   K 4.5 08/26/2019   CL 104 08/26/2019   CREATININE 0.65 08/26/2019   BUN 21 08/26/2019   CO2 27 08/26/2019   TSH 0.76 08/26/2019   HGBA1C 5.7 11/18/2017        Assessment & Plan:

## 2019-08-26 NOTE — Patient Instructions (Signed)
Please take all new medication as prescribed - the generic for lexapro 10 mg per day  Please continue all other medications as before, and refills have been done if requested - the generic xanax as needed  Please consider seeing the hospice for counseling  Please have the pharmacy call with any other refills you may need.  Please continue your efforts at being more active, low cholesterol diet, and weight control.  You are otherwise up to date with prevention measures today.  Please keep your appointments with your specialists as you may have planned  Please go to the LAB in the Basement (turn left off the elevator) for the tests to be done today  You will be contacted by phone if any changes need to be made immediately.  Otherwise, you will receive a letter about your results with an explanation, but please check with MyChart first.  Please remember to sign up for MyChart if you have not done so, as this will be important to you in the future with finding out test results, communicating by private email, and scheduling acute appointments online when needed.  Please return in 1 year for your yearly visit, or sooner if needed, with Lab testing done 3-5 days before

## 2019-08-27 ENCOUNTER — Encounter: Payer: Self-pay | Admitting: Internal Medicine

## 2019-08-27 NOTE — Assessment & Plan Note (Addendum)
Worsening recurrent onset, for lexapo 10 qd, declines counseling referral  In addition to the time spent performing CPE, I spent an additional 25 minutes face to face,in which greater than 50% of this time was spent in counseling and coordination of care for patient's acute illness as documented, including the differential dx, treatment, further evaluation and other management of depression, HLD, insomnia, hyperglycemia, elevated BP

## 2019-08-27 NOTE — Assessment & Plan Note (Signed)
stable overall by history and exam, recent data reviewed with pt, and pt to continue medical treatment as before,  to f/u any worsening symptoms or concerns  

## 2019-08-27 NOTE — Assessment & Plan Note (Signed)

## 2019-08-27 NOTE — Assessment & Plan Note (Signed)
To renew xanax qhs prn,  to f/u any worsening symptoms or concerns

## 2019-08-27 NOTE — Assessment & Plan Note (Signed)
Lab Results  Component Value Date   LDLCALC 73 08/26/2019

## 2020-01-08 ENCOUNTER — Ambulatory Visit: Payer: Medicare Other | Attending: Internal Medicine

## 2020-01-08 DIAGNOSIS — Z23 Encounter for immunization: Secondary | ICD-10-CM | POA: Insufficient documentation

## 2020-01-08 NOTE — Progress Notes (Signed)
   Covid-19 Vaccination Clinic  Name:  Kelli Nielsen    MRN: [id number] DOB: 03-06-1936  01/08/2020  Kelli Nielsen was observed post Covid-19 immunization for 15 minutes without incidence. She was provided with Vaccine Information Sheet and instruction to access the V-Safe system.   Kelli Nielsen was instructed to call 911 with any severe reactions post vaccine: Marland Kitchen Difficulty breathing  . Swelling of your face and throat  . A fast heartbeat  . A bad rash all over your body  . Dizziness and weakness    Immunizations Administered    Name Date Dose VIS Date Route   Pfizer COVID-19 Vaccine 01/08/2020  9:41 AM 0.3 mL 11/04/2019 Intramuscular   Manufacturer: [place]   Lot: [id number]   [place]: [id number]

## 2020-01-31 ENCOUNTER — Ambulatory Visit: Payer: Medicare Other | Attending: Internal Medicine

## 2020-01-31 DIAGNOSIS — Z23 Encounter for immunization: Secondary | ICD-10-CM | POA: Insufficient documentation

## 2020-01-31 NOTE — Progress Notes (Signed)
   Covid-19 Vaccination Clinic  Name:  Kelli Nielsen    MRN: [id number] DOB: Feb 03, 1936  01/31/2020  [name] was observed post Covid-19 immunization for 15 minutes without incident. She was provided with Vaccine Information Sheet and instruction to access the V-Safe system.   [name] was instructed to call 911 with any severe reactions post vaccine: Marland Kitchen Difficulty breathing  . Swelling of face and throat  . A fast heartbeat  . A bad rash all over body  . Dizziness and weakness   Immunizations Administered    Name Date Dose VIS Date Route   Pfizer COVID-19 Vaccine 01/31/2020 10:02 AM 0.3 mL 11/04/2019 Intramuscular   Manufacturer: [place]   Lot: [id number]   [place]: [id number]

## 2020-03-29 ENCOUNTER — Other Ambulatory Visit: Payer: Self-pay | Admitting: Internal Medicine

## 2020-03-29 NOTE — Telephone Encounter (Signed)
Done erx 

## 2020-07-09 DIAGNOSIS — H5203 Hypermetropia, bilateral: Secondary | ICD-10-CM | POA: Diagnosis not present

## 2020-07-09 DIAGNOSIS — H524 Presbyopia: Secondary | ICD-10-CM | POA: Diagnosis not present

## 2020-08-21 ENCOUNTER — Telehealth: Payer: Self-pay | Admitting: Internal Medicine

## 2020-08-21 NOTE — Telephone Encounter (Signed)
    Patient calling for advice for vaginal itching. Patient states she has had yeast infections in the past. Requesting OTC med or "1 pill treatment"

## 2020-08-21 NOTE — Telephone Encounter (Signed)
    Patient calling again for advice on yeast infection. Requesting med be call to pharmacy Declined appointment at this time

## 2020-08-22 MED ORDER — FLUCONAZOLE 150 MG PO TABS: ORAL_TABLET | ORAL | 1 refills | Status: DC

## 2020-08-22 NOTE — Telephone Encounter (Signed)
Spoke with pt and was able to inform her that the medication Diflucan has been sent in to her pharmacy on file.

## 2020-08-22 NOTE — Telephone Encounter (Signed)
Ok diflucan done erx

## 2020-08-22 NOTE — Telephone Encounter (Signed)
Sent to Dr. John. 

## 2020-08-22 NOTE — Telephone Encounter (Signed)
Patient called again  Please call the patient

## 2020-08-27 ENCOUNTER — Ambulatory Visit: Payer: Medicare Other | Admitting: Family

## 2020-08-29 ENCOUNTER — Ambulatory Visit: Payer: Medicare Other

## 2020-09-03 ENCOUNTER — Encounter: Payer: Medicare Other | Admitting: Internal Medicine

## 2020-09-11 ENCOUNTER — Other Ambulatory Visit: Payer: Self-pay

## 2020-09-12 ENCOUNTER — Ambulatory Visit (INDEPENDENT_AMBULATORY_CARE_PROVIDER_SITE_OTHER): Payer: Medicare Other | Admitting: Internal Medicine

## 2020-09-12 ENCOUNTER — Encounter: Payer: Self-pay | Admitting: Internal Medicine

## 2020-09-12 VITALS — BP 130/82 | HR 78 | Temp 98.8°F | Ht 68.0 in | Wt 129.0 lb

## 2020-09-12 DIAGNOSIS — M25512 Pain in left shoulder: Secondary | ICD-10-CM | POA: Insufficient documentation

## 2020-09-12 DIAGNOSIS — R739 Hyperglycemia, unspecified: Secondary | ICD-10-CM | POA: Diagnosis not present

## 2020-09-12 DIAGNOSIS — F418 Other specified anxiety disorders: Secondary | ICD-10-CM

## 2020-09-12 DIAGNOSIS — Z23 Encounter for immunization: Secondary | ICD-10-CM

## 2020-09-12 DIAGNOSIS — E559 Vitamin D deficiency, unspecified: Secondary | ICD-10-CM | POA: Diagnosis not present

## 2020-09-12 DIAGNOSIS — Z0000 Encounter for general adult medical examination without abnormal findings: Secondary | ICD-10-CM

## 2020-09-12 DIAGNOSIS — R030 Elevated blood-pressure reading, without diagnosis of hypertension: Secondary | ICD-10-CM | POA: Diagnosis not present

## 2020-09-12 DIAGNOSIS — Z0001 Encounter for general adult medical examination with abnormal findings: Secondary | ICD-10-CM

## 2020-09-12 NOTE — Patient Instructions (Addendum)
You had the flu shot today  Please take OTC Vitamin D3 at 2000 units per day, indefinitely.  Please see Sports Medicine on the first floor if your left shoulder gets worse  Please continue all other medications as before, and refills have been done if requested.  Please have the pharmacy call with any other refills you may need.  Please continue your efforts at being more active, low cholesterol diet, and weight control.  You are otherwise up to date with prevention measures today.  Please keep your appointments with your specialists as you may have planned  Please go to the LAB at the blood drawing area for the tests to be done  You will be contacted by phone if any changes need to be made immediately.  Otherwise, you will receive a letter about your results with an explanation, but please check with MyChart first.  Please remember to sign up for MyChart if you have not done so, as this will be important to you in the future with finding out test results, communicating by private email, and scheduling acute appointments online when needed.  Please make an Appointment to return for your 1 year visit, or sooner if needed

## 2020-09-12 NOTE — Progress Notes (Signed)
Subjective:    Patient ID: Kelli Nielsen, female    DOB: 09-Nov-1936, 84 y.o.   MRN: [id number]  HPI Here for wellness and f/u;  Overall doing ok;  Pt denies Chest pain, worsening SOB, DOE, wheezing, orthopnea, PND, worsening LE edema, palpitations, dizziness or syncope.  Pt denies neurological change such as new headache, facial or extremity weakness.  Pt denies polydipsia, polyuria, or low sugar symptoms. Pt states overall good compliance with treatment and medications, good tolerability, and has been trying to follow appropriate diet.  Pt denies worsening depressive symptoms, suicidal ideation or panic. No fever, night sweats, wt loss, loss of appetite, or other constitutional symptoms.  Pt states good ability with ADL's, has low fall risk, home safety reviewed and adequate, no other significant changes in hearing or vision, and only occasionally active with exercise. Husband died with hospice recently, still grieving  Still working 40 hrs at [employer]. Also has some mild intermittent left shoulder pain it seems related to carying cans of pain at the store but o/w good ROM - does not want PT, or pain med or referral. Has not taken vit d recently, but planst to start  Past Medical History:  Diagnosis Date  . Adenomatous colon polyp   . Anxiety    Past Surgical History:  Procedure Laterality Date  . APPENDECTOMY  1959   peritonitis  . BREAST BIOPSY     benign  . COLONOSCOPY  2014   negative; Dr Deatra Ina  . TONSILLECTOMY  1959    reports that she has never smoked. She has never used smokeless tobacco. She reports current alcohol use of about 2.0 standard drinks of alcohol per week. She reports that she does not use drugs. family history includes Heart disease in her mother. Allergies  Allergen Reactions  . Azithromycin     REACTION: elevated liver enzymes  . Penicillins Hives    Hives   . Celexa [Citalopram Hydrobromide]    Current Outpatient Medications on File Prior to Visit    Medication Sig Dispense Refill  . ALPRAZolam (XANAX) 0.25 MG tablet TAKE 1 TABLET BY MOUTH AT BEDTIME AS NEEDED FOR SLEEP 30 tablet 2  . Probiotic Product (ALIGN) 4 MG CAPS Take 1 capsule by mouth daily.       No current facility-administered medications on file prior to visit.   Review of Systems All otherwise neg per pt    Objective:   Physical Exam BP 130/82 (BP Location: Left Arm, Patient Position: Sitting, Cuff Size: Large)   Pulse 78   Temp 98.8 F (37.1 C) (Oral)   Ht 5\' 8"  (1.727 m)   Wt 129 lb (58.5 kg)   SpO2 97%   BMI 19.61 kg/m  VS noted,  Constitutional: Pt appears in NAD HENT: Head: NCAT.  Right Ear: External ear normal.  Left Ear: External ear normal.  Eyes: . Pupils are equal, round, and reactive to light. Conjunctivae and EOM are normal Nose: without d/c or deformity Neck: Neck supple. Gross normal ROM Cardiovascular: Normal rate and regular rhythm.   Pulmonary/Chest: Effort normal and breath sounds without rales or wheezing.  Abd:  Soft, NT, ND, + BS, no organomegaly Neurological: Pt is alert. At baseline orientation, motor grossly intact Skin: Skin is warm. No rashes, other new lesions, no LE edema Psychiatric: Pt behavior is normal without agitation  All otherwise neg per pt Lab Results  Component Value Date   WBC 4.2 09/14/2020   HGB 13.8 09/14/2020  HCT 40.8 09/14/2020   PLT 247.0 09/14/2020   GLUCOSE 100 (H) 09/14/2020   CHOL 185 09/14/2020   TRIG 56.0 09/14/2020   HDL 93.60 09/14/2020   LDLCALC 80 09/14/2020   ALT 12 09/14/2020   AST 19 09/14/2020   NA 138 09/14/2020   K 3.8 09/14/2020   CL 103 09/14/2020   CREATININE 0.68 09/14/2020   BUN 24 (H) 09/14/2020   CO2 28 09/14/2020   TSH 1.19 09/14/2020   HGBA1C 5.6 09/14/2020      Assessment & Plan:

## 2020-09-14 ENCOUNTER — Other Ambulatory Visit (INDEPENDENT_AMBULATORY_CARE_PROVIDER_SITE_OTHER): Payer: Medicare Other

## 2020-09-14 ENCOUNTER — Encounter: Payer: Self-pay | Admitting: Internal Medicine

## 2020-09-14 DIAGNOSIS — Z0001 Encounter for general adult medical examination with abnormal findings: Secondary | ICD-10-CM

## 2020-09-14 DIAGNOSIS — E559 Vitamin D deficiency, unspecified: Secondary | ICD-10-CM | POA: Diagnosis not present

## 2020-09-14 DIAGNOSIS — R739 Hyperglycemia, unspecified: Secondary | ICD-10-CM | POA: Diagnosis not present

## 2020-09-14 LAB — CBC WITH DIFFERENTIAL/PLATELET
Basophils Absolute: 0 10*3/uL (ref 0.0–0.1)
Basophils Relative: 0.8 % (ref 0.0–3.0)
Eosinophils Absolute: 0.1 10*3/uL (ref 0.0–0.7)
Eosinophils Relative: 1.8 % (ref 0.0–5.0)
HCT: 40.8 % (ref 36.0–46.0)
Hemoglobin: 13.8 g/dL (ref 12.0–15.0)
Lymphocytes Relative: 32.4 % (ref 12.0–46.0)
Lymphs Abs: 1.4 10*3/uL (ref 0.7–4.0)
MCHC: 33.8 g/dL (ref 30.0–36.0)
MCV: 101.6 fl — ABNORMAL HIGH (ref 78.0–100.0)
Monocytes Absolute: 0.5 10*3/uL (ref 0.1–1.0)
Monocytes Relative: 12.3 % — ABNORMAL HIGH (ref 3.0–12.0)
Neutro Abs: 2.2 10*3/uL (ref 1.4–7.7)
Neutrophils Relative %: 52.7 % (ref 43.0–77.0)
Platelets: 247 10*3/uL (ref 150.0–400.0)
RBC: 4.02 Mil/uL (ref 3.87–5.11)
RDW: 12.9 % (ref 11.5–15.5)
WBC: 4.2 10*3/uL (ref 4.0–10.5)

## 2020-09-14 LAB — BASIC METABOLIC PANEL
BUN: 24 mg/dL — ABNORMAL HIGH (ref 6–23)
CO2: 28 mEq/L (ref 19–32)
Calcium: 9.1 mg/dL (ref 8.4–10.5)
Chloride: 103 mEq/L (ref 96–112)
Creatinine, Ser: 0.68 mg/dL (ref 0.40–1.20)
GFR: 81.09 mL/min (ref >60.00)
Glucose, Bld: 100 mg/dL — ABNORMAL HIGH (ref 70–99)
Potassium: 3.8 mEq/L (ref 3.5–5.1)
Sodium: 138 mEq/L (ref 135–145)

## 2020-09-14 LAB — URINALYSIS, ROUTINE W REFLEX MICROSCOPIC
Bilirubin Urine: NEGATIVE
Hgb urine dipstick: NEGATIVE
Ketones, ur: NEGATIVE
Leukocytes,Ua: NEGATIVE
Nitrite: NEGATIVE
RBC / HPF: NONE SEEN (ref 0–?)
Specific Gravity, Urine: 1.02 (ref 1.000–1.030)
Total Protein, Urine: NEGATIVE
Urine Glucose: NEGATIVE
Urobilinogen, UA: 0.2 (ref 0.0–1.0)
WBC, UA: NONE SEEN (ref 0–?)
pH: 6 (ref 5.0–8.0)

## 2020-09-14 LAB — LIPID PANEL
Cholesterol: 185 mg/dL (ref 0–200)
HDL: 93.6 mg/dL (ref >39.00)
LDL Cholesterol: 80 mg/dL (ref 0–99)
NonHDL: 90.97
Total CHOL/HDL Ratio: 2
Triglycerides: 56 mg/dL (ref 0.0–149.0)
VLDL: 11.2 mg/dL (ref 0.0–40.0)

## 2020-09-14 LAB — HEMOGLOBIN A1C: Hgb A1c MFr Bld: 5.6 % (ref 4.6–6.5)

## 2020-09-14 LAB — HEPATIC FUNCTION PANEL
ALT: 12 U/L (ref 0–35)
AST: 19 U/L (ref 0–37)
Albumin: 4.3 g/dL (ref 3.5–5.2)
Alkaline Phosphatase: 40 U/L (ref 39–117)
Bilirubin, Direct: 0.2 mg/dL (ref 0.0–0.3)
Total Bilirubin: 0.9 mg/dL (ref 0.2–1.2)
Total Protein: 6.8 g/dL (ref 6.0–8.3)

## 2020-09-14 LAB — TSH: TSH: 1.19 u[IU]/mL (ref 0.35–4.50)

## 2020-09-14 LAB — VITAMIN D 25 HYDROXY (VIT D DEFICIENCY, FRACTURES): VITD: 20.63 ng/mL — ABNORMAL LOW (ref 30.00–100.00)

## 2020-09-17 ENCOUNTER — Encounter: Payer: Self-pay | Admitting: Internal Medicine

## 2020-09-17 NOTE — Assessment & Plan Note (Signed)
stable overall by history and exam, recent data reviewed with pt, and pt to continue medical treatment as before,  to f/u any worsening symptoms or concerns  

## 2020-09-17 NOTE — Assessment & Plan Note (Signed)

## 2020-09-17 NOTE — Assessment & Plan Note (Signed)
To start vit d 2000 u qd 

## 2020-09-17 NOTE — Assessment & Plan Note (Addendum)
Etiology unclear, for pain control, and f/u with sports medicine  I spent 31 minutes in preparing to see the patient by review of recent labs, imaging and procedures, obtaining and reviewing separately obtained history, communicating with the patient and family or caregiver, ordering medications, tests or procedures, and documenting clinical information in the EHR including the differential Dx, treatment, and any further evaluation and other management of left shoulder pain, vit d def, elev bp without htn, hyperglycemia, depression anxiety

## 2020-09-24 ENCOUNTER — Ambulatory Visit: Payer: Medicare Other

## 2020-09-26 ENCOUNTER — Other Ambulatory Visit: Payer: Self-pay | Admitting: Internal Medicine

## 2021-04-24 ENCOUNTER — Other Ambulatory Visit: Payer: Self-pay

## 2021-04-24 ENCOUNTER — Encounter: Payer: Self-pay | Admitting: Internal Medicine

## 2021-04-24 ENCOUNTER — Ambulatory Visit: Payer: Medicare Other | Admitting: Internal Medicine

## 2021-04-24 VITALS — BP 162/76 | HR 91 | Temp 98.2°F | Ht 68.0 in | Wt 127.4 lb

## 2021-04-24 DIAGNOSIS — F418 Other specified anxiety disorders: Secondary | ICD-10-CM

## 2021-04-24 DIAGNOSIS — M25562 Pain in left knee: Secondary | ICD-10-CM

## 2021-04-24 DIAGNOSIS — E559 Vitamin D deficiency, unspecified: Secondary | ICD-10-CM | POA: Diagnosis not present

## 2021-04-24 DIAGNOSIS — R030 Elevated blood-pressure reading, without diagnosis of hypertension: Secondary | ICD-10-CM

## 2021-04-24 DIAGNOSIS — R42 Dizziness and giddiness: Secondary | ICD-10-CM | POA: Insufficient documentation

## 2021-04-24 DIAGNOSIS — R739 Hyperglycemia, unspecified: Secondary | ICD-10-CM

## 2021-04-24 DIAGNOSIS — J309 Allergic rhinitis, unspecified: Secondary | ICD-10-CM

## 2021-04-24 NOTE — Progress Notes (Signed)
Patient ID: Kelli Nielsen, female   DOB: [birth year], 85 y.o.   MRN: [id number]        Chief Complaint: numerous problems including dizziness, alelrgies left ear fullness, low vit d, hyperglycemia, elevate BP today, insomnia with anxiety /depression, left knee painful posterior mass       HPI:  Kelli Nielsen is a 85 y.o. female here with c/o Does have several wks ongoing nasal allergy symptoms with clearish congestion, itch and sneezing, without fever, pain, ST, cough, swelling or wheezing, but does have left ear fullness and dizziness.  Not taking Vit D.   Pt denies polydipsia, polyuria, or new focal neuro s/s.  BP have been well controlled at home.  Pt denies chest pain, increased sob or doe, wheezing, orthopnea, PND, increased LE swelling, palpitations, dizziness or syncope.  Also c/o 1 wk worsening left knee pain and swelling with an egg like mass at the back of the knee, no giveaways or falls, just hurts, and some swelling somewhat worse to the end of the leg and foot as well.         Wt Readings from Last 3 Encounters:  04/24/21 127 lb 6.4 oz (57.8 kg)  09/12/20 129 lb (58.5 kg)  08/26/19 123 lb (55.8 kg)   BP Readings from Last 3 Encounters:  04/24/21 (!) 162/76  09/12/20 130/82  08/26/19 124/78         Past Medical History:  Diagnosis Date  . Adenomatous colon polyp   . Anxiety    Past Surgical History:  Procedure Laterality Date  . APPENDECTOMY  1959   peritonitis  . BREAST BIOPSY     benign  . COLONOSCOPY  2014   negative; Dr Deatra Ina  . TONSILLECTOMY  1959    reports that she has never smoked. She has never used smokeless tobacco. She reports current alcohol use of about 2.0 standard drinks of alcohol per week. She reports that she does not use drugs. family history includes Heart disease in her mother. Allergies  Allergen Reactions  . Azithromycin     REACTION: elevated liver enzymes  . Penicillins Hives    Hives   . Celexa [Citalopram Hydrobromide]    Current  Outpatient Medications on File Prior to Visit  Medication Sig Dispense Refill  . Probiotic Product (ALIGN) 4 MG CAPS Take 1 capsule by mouth daily.    Marland Kitchen ALPRAZolam (XANAX) 0.25 MG tablet TAKE 1 TABLET BY MOUTH AT BEDTIME AS NEEDED FOR SLEEP (Patient not taking: Reported on 04/24/2021) 30 tablet 2   No current facility-administered medications on file prior to visit.        ROS:  All others reviewed and negative.  Objective        PE:  BP (!) 162/76 (BP Location: Right Arm, Patient Position: Sitting, Cuff Size: Normal)   Pulse 91   Temp 98.2 F (36.8 C) (Oral)   Ht 5\' 8"  (1.727 m)   Wt 127 lb 6.4 oz (57.8 kg)   SpO2 98%   BMI 19.37 kg/m                 Constitutional: Pt appears in NAD               HENT: Head: NCAT.                Right Ear: External ear normal.                 Left Ear: External ear normal.  Bilat tm's with mild erythema.  Max sinus areas non tender.  Pharynx with mild erythema, no exudate               Eyes: . Pupils are equal, round, and reactive to light. Conjunctivae and EOM are normal               Nose: without d/c or deformity               Neck: Neck supple. Gross normal ROM               Cardiovascular: Normal rate and regular rhythm.                 Pulmonary/Chest: Effort normal and breath sounds without rales or wheezing.                Left knee with 1+ effusion and post baker cyst like soft tender but firm mass               Neurological: Pt is alert. At baseline orientation, motor grossly intact               Skin: Skin is warm. No rashes, no other new lesions, LE edema - trace LLE pedal edema               Psychiatric: Pt behavior is normal without agitation   Micro: none  Cardiac tracings I have personally interpreted today:  none  Pertinent Radiological findings (summarize): none   Lab Results  Component Value Date   WBC 4.2 09/14/2020   HGB 13.8 09/14/2020   HCT 40.8 09/14/2020   PLT 247.0 09/14/2020   GLUCOSE 100 (H) 09/14/2020   CHOL  185 09/14/2020   TRIG 56.0 09/14/2020   HDL 93.60 09/14/2020   LDLCALC 80 09/14/2020   ALT 12 09/14/2020   AST 19 09/14/2020   NA 138 09/14/2020   K 3.8 09/14/2020   CL 103 09/14/2020   CREATININE 0.68 09/14/2020   BUN 24 (H) 09/14/2020   CO2 28 09/14/2020   TSH 1.19 09/14/2020   HGBA1C 5.6 09/14/2020   Assessment/Plan:  Kelli Nielsen is a 85 y.o. White or Caucasian [1] female with  has a past medical history of Adenomatous colon polyp and Anxiety.  Dizziness Most likely related to ear effusions, for mucinex bid prn  Allergic rhinitis Mild to mod, for claritin prn,  to f/u any worsening symptoms or concerns  Vitamin D deficiency Last vitamin D Lab Results  Component Value Date   VD25OH 20.63 (L) 09/14/2020   Low, to start oral replacement  Hyperglycemia Lab Results  Component Value Date   HGBA1C 5.6 09/14/2020   Stable, pt to continue current medical treatment  - diet   Elevated blood pressure reading without diagnosis of hypertension BP Readings from Last 3 Encounters:  04/24/21 (!) 162/76  09/12/20 130/82  08/26/19 124/78   Stable, pt to continue medical treatment  - diet and wt control, low salt diet, declines med tx at this time   Depression with anxiety With insomnia, declines med tx or referral for counseling or psychiatry  Left knee pain With effusion and possible baker cyst, pt to f/u with making appt with sport med this facility  Followup: Return in about 6 months (around 10/24/2021), or if symptoms worsen or fail to improve.  [name], MD 04/27/2021 10:43 PM [facility]

## 2021-04-24 NOTE — Patient Instructions (Signed)
Please take OTC Vitamin D3 at 2000 units per day, indefinitely  Ok to start OTC Claritin 10 mg per day, as well as OTC Mucinex twice per day  You may also want to consider adding OTC Nasacort to help the sinus stay open to let the ears drain better as well  Please continue to monitor the BP at home with the goal being to be less than 140/90 on average  Please stop at the first floor Sports Medicine for appt with Dr Tamala Julian for the left knee arthritis and Baker's cyst  Please continue all other medications as before, and refills have been done if requested.  Please have the pharmacy call with any other refills you may need.  Please continue your efforts at being more active, low cholesterol diet, and weight control.  Please keep your appointments with your specialists as you may have planned  Please make an Appointment to return in 6 months, or sooner if needed

## 2021-04-27 ENCOUNTER — Encounter: Payer: Self-pay | Admitting: Internal Medicine

## 2021-04-27 DIAGNOSIS — J309 Allergic rhinitis, unspecified: Secondary | ICD-10-CM | POA: Insufficient documentation

## 2021-04-27 DIAGNOSIS — M25562 Pain in left knee: Secondary | ICD-10-CM | POA: Insufficient documentation

## 2021-04-27 NOTE — Assessment & Plan Note (Signed)
BP Readings from Last 3 Encounters:  04/24/21 (!) 162/76  09/12/20 130/82  08/26/19 124/78   Stable, pt to continue medical treatment  - diet and wt control, low salt diet, declines med tx at this time

## 2021-04-27 NOTE — Assessment & Plan Note (Signed)
With effusion and possible baker cyst, pt to f/u with making appt with sport med this facility

## 2021-04-27 NOTE — Assessment & Plan Note (Signed)
Mild to mod, for claritin prn,  to f/u any worsening symptoms or concerns

## 2021-04-27 NOTE — Assessment & Plan Note (Signed)
Lab Results  Component Value Date   HGBA1C 5.6 09/14/2020   Stable, pt to continue current medical treatment  - diet

## 2021-04-27 NOTE — Assessment & Plan Note (Signed)
With insomnia, declines med tx or referral for counseling or psychiatry

## 2021-04-27 NOTE — Assessment & Plan Note (Signed)
Most likely related to ear effusions, for mucinex bid prn

## 2021-04-27 NOTE — Assessment & Plan Note (Signed)
Last vitamin D Lab Results  Component Value Date   VD25OH 20.63 (L) 09/14/2020   Low, to start oral replacement

## 2021-05-16 NOTE — Progress Notes (Signed)
[facility] [facility] [address] [place] Phone: [phone] Subjective:   I Kelli Nielsen am serving as a Education administrator for Dr. Hulan Saas.  This visit occurred during the SARS-CoV-2 public health emergency.  Safety protocols were in place, including screening questions prior to the visit, additional usage of staff PPE, and extensive cleaning of exam room while observing appropriate contact time as indicated for disinfecting solutions.   I'm seeing this patient by the request  of:  Biagio Borg, MD  CC: Knee pain  IOM:[id number]  Kelli Nielsen is a 85 y.o. female coming in with complaint of L knee pain. Patient states the pain has been chronic and has gradually gotten worse. Pain with walking. Doesn't really remember the MOI. Has tried Ibuprofen. States she has a lump in the posterior knee. Knee swells a little. Wearing a compression sleeve lately for pain. 3-4/10.     Past Medical History:  Diagnosis Date   Adenomatous colon polyp    Anxiety    Past Surgical History:  Procedure Laterality Date   APPENDECTOMY  1959   peritonitis   BREAST BIOPSY     benign   COLONOSCOPY  2014   negative; Dr Deatra Ina   TONSILLECTOMY  1959   Social History   Socioeconomic History   Marital status: Married    Spouse name: Not on file   Number of children: 1   Years of education: 13   Highest education level: Not on file  Occupational History   Not on file  Tobacco Use   Smoking status: Never   Smokeless tobacco: Never  Vaping Use   Vaping Use: Never used  Substance and Sexual Activity   Alcohol use: Yes    Alcohol/week: 2.0 standard drinks    Types: 2 Glasses of wine per week   Drug use: No   Sexual activity: Not on file  Other Topics Concern   Not on file  Social History Narrative   Fun: Not much fun    Denies abuse and feels safe at home.    Social Determinants of Health   Financial Resource Strain: Not on file  Food Insecurity: Not on  file  Transportation Needs: Not on file  Physical Activity: Not on file  Stress: Not on file  Social Connections: Not on file   Allergies  Allergen Reactions   Azithromycin     REACTION: elevated liver enzymes   Penicillins Hives    Hives    Celexa [Citalopram Hydrobromide]    Family History  Problem Relation Age of Onset   Heart disease Mother        ? MI   Diabetes Neg Hx    Cancer Neg Hx    Stroke Neg Hx          Current Outpatient Medications (Other):    ALPRAZolam (XANAX) 0.25 MG tablet, TAKE 1 TABLET BY MOUTH AT BEDTIME AS NEEDED FOR SLEEP   Probiotic Product (ALIGN) 4 MG CAPS, Take 1 capsule by mouth daily.   Reviewed prior external information including notes and imaging from  primary care provider As well as notes that were available from care everywhere and other healthcare systems.  Past medical history, social, surgical and family history all reviewed in electronic medical record.  No pertanent information unless stated regarding to the chief complaint.   Review of Systems:  No headache, visual changes, nausea, vomiting, diarrhea, constipation, dizziness, abdominal pain, skin rash, fevers, chills, night sweats, weight  loss, swollen lymph nodes, body aches, joint swelling, chest pain, shortness of breath, mood changes. POSITIVE muscle aches  Objective  Blood pressure (!) 140/92, pulse 92, height 5\' 8"  (1.727 m), weight 127 lb (57.6 kg), SpO2 98 %.   General: No apparent distress alert and oriented x3 mood and affect normal, dressed appropriately.  HEENT: Pupils equal, extraocular movements intact  Respiratory: Patient's speak in full sentences and does not appear short of breath  Cardiovascular: No lower extremity edema, non tender, no erythema  Gait mild antalgic with mild instability. MSK: Left knee exam does have some arthritic changes as well as some instability noted with valgus and varus force.  Patient has only 90 degrees of flexion secondary to  fullness in the popliteal area.  Patient does have a mass palpated  Limited musculoskeletal ultrasound was performed and interpreted by Lyndal Pulley  Limited musculoskeletal ultrasound shows the patient does have effusion noted of the patellofemoral joint with moderate narrowing.  Patient does also have signs consistent with a synovitis.  Patient has a very large Baker's cyst noted as well.  Procedure: Real-time Ultrasound Guided Injection of left knee Baker's cyst Device: GE Logiq Q7 Ultrasound guided injection is preferred based studies that show increased duration, increased effect, greater accuracy, decreased procedural pain, increased response rate, and decreased cost with ultrasound guided versus blind injection.  Verbal informed consent obtained.  Time-out conducted.  Noted no overlying erythema, induration, or other signs of local infection.  Skin prepped in a sterile fashion.  Local anesthesia: Topical Ethyl chloride.  With sterile technique and under real time ultrasound guidance: With a 22-gauge 2 inch needle patient was injected with 2 cc of 0.5% Marcaine and then aspirated 20 cc of straw-colored fluid and then injected 1 cc of Kenalog 40 mg/mL into the Baker's cyst posterior approach Completed without difficulty  Pain immediately resolved suggesting accurate placement of the medication.  Advised to call if fevers/chills, erythema, induration, drainage, or persistent bleeding.  Impression: Technically successful ultrasound guided injection. Impression and Recommendations:     The above documentation has been reviewed and is accurate and complete Lyndal Pulley, DO

## 2021-05-17 ENCOUNTER — Other Ambulatory Visit: Payer: Self-pay

## 2021-05-17 ENCOUNTER — Encounter: Payer: Self-pay | Admitting: Family Medicine

## 2021-05-17 ENCOUNTER — Ambulatory Visit: Payer: Self-pay

## 2021-05-17 ENCOUNTER — Ambulatory Visit: Payer: Medicare Other | Admitting: Family Medicine

## 2021-05-17 ENCOUNTER — Ambulatory Visit (INDEPENDENT_AMBULATORY_CARE_PROVIDER_SITE_OTHER): Payer: Medicare Other

## 2021-05-17 VITALS — BP 140/92 | HR 92 | Ht 68.0 in | Wt 127.0 lb

## 2021-05-17 DIAGNOSIS — M25562 Pain in left knee: Secondary | ICD-10-CM

## 2021-05-17 DIAGNOSIS — M1712 Unilateral primary osteoarthritis, left knee: Secondary | ICD-10-CM | POA: Diagnosis not present

## 2021-05-17 DIAGNOSIS — M7122 Synovial cyst of popliteal space [Baker], left knee: Secondary | ICD-10-CM | POA: Diagnosis not present

## 2021-05-17 DIAGNOSIS — G8929 Other chronic pain: Secondary | ICD-10-CM | POA: Diagnosis not present

## 2021-05-17 NOTE — Assessment & Plan Note (Signed)
Patient had aspiration done today.  Tolerated the procedure well.  Patient had improvement in range of motion immediately.  Hinged brace given secondary to the instability noted.  Patient though is very active.  Hopefully patient will continue to be very active.  Follow-up with me again in 6 weeks to make sure that there is no reaccumulation.

## 2021-05-17 NOTE — Patient Instructions (Addendum)
Good to see you Xray of the knee today Drained the cyst Tart cherry extract 1200mg  at night Brace  If redness or swelling seek medical attention See me again in 6 weeks

## 2021-05-17 NOTE — Assessment & Plan Note (Signed)
On ultrasound seems to be mostly patellofemoral but does have some narrowing of the medial and lateral joint space as well.  Patient did have a mild synovitis as well that we will continue to monitor.  Discussed continued compression and due to the instability given a hinged brace.  Follow-up with me again in 4 to 6 weeks

## 2021-06-27 ENCOUNTER — Other Ambulatory Visit: Payer: Self-pay

## 2021-06-27 ENCOUNTER — Ambulatory Visit (INDEPENDENT_AMBULATORY_CARE_PROVIDER_SITE_OTHER): Payer: Medicare Other

## 2021-06-27 DIAGNOSIS — Z0000 Encounter for general adult medical examination without abnormal findings: Secondary | ICD-10-CM

## 2021-06-27 NOTE — Progress Notes (Signed)
[place] Phone: [phone] Subjective:   Kelli Nielsen, am serving as a scribe for Dr. Hulan Saas.  This visit occurred during the SARS-CoV-2 public health emergency.  Safety protocols were in place, including screening questions prior to the visit, additional usage of staff PPE, and extensive cleaning of exam room while observing appropriate contact time as indicated for disinfecting solutions.    I'm seeing this patient by the request  of:  Biagio Borg, MD  CC: Left knee pain follow-up  [id number]  05/17/2021 On ultrasound seems to be mostly patellofemoral but does have some narrowing of the medial and lateral joint space as well.  Patient did have a mild synovitis as well that we will continue to monitor.  Discussed continued compression and due to the instability given a hinged brace.  Follow-up with me again in 4 to 6 weeks  Patient had aspiration done today.  Tolerated the procedure well.  Patient had improvement in range of motion immediately.  Hinged brace given secondary to the instability noted.  Patient though is very active.  Hopefully patient will continue to be very active.  Follow-up with me again in 6 weeks to make sure that there is Nielsen reaccumulation.  Update 06/28/2021 Kelli Nielsen is a 85 y.o. female coming in with complaint of L knee pain. Patient states that she is doing much better. Patient does have some swelling but not where she was at last visit. Little to Nielsen pain in the knee. Is wearing knee sleeve.        Past Medical History:  Diagnosis Date   Adenomatous colon polyp    Anxiety    Past Surgical History:  Procedure Laterality Date   APPENDECTOMY  1959   peritonitis   BREAST BIOPSY     benign   COLONOSCOPY  2014   negative; Dr Deatra Ina   TONSILLECTOMY  1959   Social History   Socioeconomic History   Marital status: Married    Spouse name: Not on file   Number of  children: 1   Years of education: 13   Highest education level: Not on file  Occupational History   Not on file  Tobacco Use   Smoking status: Never   Smokeless tobacco: Never  Vaping Use   Vaping Use: Never used  Substance and Sexual Activity   Alcohol use: Yes    Alcohol/week: 2.0 standard drinks    Types: 2 Glasses of wine per week   Drug use: Nielsen   Sexual activity: Not on file  Other Topics Concern   Not on file  Social History Narrative   Fun: Not much fun    Denies abuse and feels safe at home.    Social Determinants of Health   Financial Resource Strain: Low Risk    Difficulty of Paying Living Expenses: Not hard at all  Food Insecurity: Nielsen Food Insecurity   Worried About Charity fundraiser in the Last Year: Never true   Cable in the Last Year: Never true  Transportation Needs: Nielsen Transportation Needs   Lack of Transportation (Medical): Nielsen   Lack of Transportation (Non-Medical): Nielsen  Physical Activity: Sufficiently Active   Days of Exercise per Week: 5 days   Minutes of Exercise per Session: 30 min  Stress: Nielsen Stress Concern Present   Feeling of Stress : Not at all  Social Connections: Socially Isolated   Frequency of  Communication with Friends and Family: More than three times a week   Frequency of Social Gatherings with Friends and Family: Once a week   Attends Religious Services: Never   Marine scientist or Organizations: Nielsen   Attends Music therapist: Never   Marital Status: Divorced   Allergies  Allergen Reactions   Azithromycin     REACTION: elevated liver enzymes   Penicillins Hives    Hives    Celexa [Citalopram Hydrobromide]    Family History  Problem Relation Age of Onset   Heart disease Mother        ? MI   Diabetes Neg Hx    Cancer Neg Hx    Stroke Neg Hx          Current Outpatient Medications (Other):    ALPRAZolam (XANAX) 0.25 MG tablet, TAKE 1 TABLET BY MOUTH AT BEDTIME AS NEEDED FOR SLEEP    Probiotic Product (ALIGN) 4 MG CAPS, Take 1 capsule by mouth daily.   Reviewed prior external information including notes and imaging from  primary care provider As well as notes that were available from care everywhere and other healthcare systems.  Past medical history, social, surgical and family history all reviewed in electronic medical record.  Nielsen pertanent information unless stated regarding to the chief complaint.   Review of Systems:  Nielsen headache, visual changes, nausea, vomiting, diarrhea, constipation, dizziness, abdominal pain, skin rash, fevers, chills, night sweats, weight loss, swollen lymph nodes, body aches, joint swelling, chest pain, shortness of breath, mood changes. POSITIVE muscle aches  Objective  Blood pressure 132/88, pulse (!) 110, height '5\' 8"'$  (1.727 m), weight 128 lb (58.1 kg), SpO2 98 %.   General: Nielsen apparent distress alert and oriented x3 mood and affect normal, dressed appropriately.  HEENT: Pupils equal, extraocular movements intact  Respiratory: Patient's speak in full sentences and does not appear short of breath  Cardiovascular: Nielsen lower extremity edema, non tender, Nielsen erythema  Gait normal with good balance and coordination.  MSK: Patient's left knee still has some mild instability with valgus and varus force.  Mildly reaccumulation of feels of the popliteal area.  Trace effusion noted of the patellofemoral joint.  Limited muscular skeletal ultrasound was performed and interpreted by Hulan Saas, M  Limited ultrasound of patient's knee shows the patient does have a trace effusion noted of the patellofemoral joint.  Mild narrowing noted of the patellofemoral and lateral joint space.  Patient does have what appears to be hypoechoic changes surrounding the LCL noted as well.  This was not seen previously.  Mild reaccumulation of the Baker's cyst noted in the popliteal area as well.  Still significantly smaller than previous exam. Impression: Trace effusion  of the knee with Baker's cyst and questionable LCL disruption    Impression and Recommendations:     The above documentation has been reviewed and is accurate and complete Lyndal Pulley, DO

## 2021-06-27 NOTE — Patient Instructions (Signed)
[name] , Thank you for taking time to come for your Medicare Wellness Visit. I appreciate your ongoing commitment to your health goals. Please review the following plan we discussed and let me know if I can assist you in the future.   Screening recommendations/referrals: Colonoscopy: not a candidate due to age 85: not a candidate due to age/patient's request Bone Density: never done Recommended yearly ophthalmology/optometry visit for glaucoma screening and checkup Recommended yearly dental visit for hygiene and checkup  Vaccinations: Influenza vaccine: 09/12/2020 Pneumococcal vaccine: 09/04/2008, 03/27/2016 Tdap vaccine: 11/21/2011; due every 10 years Shingles vaccine: never done   Covid-19:01/08/2020, 01/31/2020  Advanced directives: Please bring a copy of your health care power of attorney and living will to the office at your convenience.  Conditions/risks identified: Yes; Client understands the importance of follow-up with providers by attending scheduled visits and discussed goals to eat healthier, increase physical activity, exercise the brain, socialize more, get enough sleep and make time for laughter.  Next appointment: Please schedule your next Medicare Wellness Visit with your Nurse Health Advisor in 1 year by calling [phone].   Preventive Care 60 Years and Older, Female Preventive care refers to lifestyle choices and visits with your health care provider that can promote health and wellness. What does preventive care include? A yearly physical exam. This is also called an annual well check. Dental exams once or twice a year. Routine eye exams. Ask your health care provider how often you should have your eyes checked. Personal lifestyle choices, including: Daily care of your teeth and gums. Regular physical activity. Eating a healthy diet. Avoiding tobacco and drug use. Limiting alcohol use. Practicing safe sex. Taking low-dose aspirin every day. Taking  vitamin and mineral supplements as recommended by your health care provider. What happens during an annual well check? The services and screenings done by your health care provider during your annual well check will depend on your age, overall health, lifestyle risk factors, and family history of disease. Counseling  Your health care provider may ask you questions about your: Alcohol use. Tobacco use. Drug use. Emotional well-being. Home and relationship well-being. Sexual activity. Eating habits. History of falls. Memory and ability to understand (cognition). Work and work Statistician. Reproductive health. Screening  You may have the following tests or measurements: Height, weight, and BMI. Blood pressure. Lipid and cholesterol levels. These may be checked every 5 years, or more frequently if you are over 35 years old. Skin check. Lung cancer screening. You may have this screening every year starting at age 14 if you have a 30-pack-year history of smoking and currently smoke or have quit within the past 15 years. Fecal occult blood test (FOBT) of the stool. You may have this test every year starting at age 9. Flexible sigmoidoscopy or colonoscopy. You may have a sigmoidoscopy every 5 years or a colonoscopy every 10 years starting at age 97. Hepatitis C blood test. Hepatitis B blood test. Sexually transmitted disease (STD) testing. Diabetes screening. This is done by checking your blood sugar (glucose) after you have not eaten for a while (fasting). You may have this done every 1-3 years. Bone density scan. This is done to screen for osteoporosis. You may have this done starting at age 72. Mammogram. This may be done every 1-2 years. Talk to your health care provider about how often you should have regular mammograms. Talk with your health care provider about your test results, treatment options, and if necessary, the need for more tests. Vaccines  Your health care provider may  recommend certain vaccines, such as: Influenza vaccine. This is recommended every year. Tetanus, diphtheria, and acellular pertussis (Tdap, Td) vaccine. You may need a Td booster every 10 years. Zoster vaccine. You may need this after age 74. Pneumococcal 13-valent conjugate (PCV13) vaccine. One dose is recommended after age 58. Pneumococcal polysaccharide (PPSV23) vaccine. One dose is recommended after age 67. Talk to your health care provider about which screenings and vaccines you need and how often you need them. This information is not intended to replace advice given to you by your health care provider. Make sure you discuss any questions you have with your health care provider. Document Released: 12/07/2015 Document Revised: 07/30/2016 Document Reviewed: 09/11/2015 Elsevier Interactive Patient Education  2017 [place] Prevention in the Home Falls can cause injuries. They can happen to people of all ages. There are many things you can do to make your home safe and to help prevent falls. What can I do on the outside of my home? Regularly fix the edges of walkways and driveways and fix any cracks. Remove anything that might make you trip as you walk through a door, such as a raised step or threshold. Trim any bushes or trees on the path to your home. Use bright outdoor lighting. Clear any walking paths of anything that might make someone trip, such as rocks or tools. Regularly check to see if handrails are loose or broken. Make sure that both sides of any steps have handrails. Any raised decks and porches should have guardrails on the edges. Have any leaves, snow, or ice cleared regularly. Use sand or salt on walking paths during winter. Clean up any spills in your garage right away. This includes oil or grease spills. What can I do in the bathroom? Use night lights. Install grab bars by the toilet and in the tub and shower. Do not use towel bars as grab bars. Use non-skid  mats or decals in the tub or shower. If you need to sit down in the shower, use a plastic, non-slip stool. Keep the floor dry. Clean up any water that spills on the floor as soon as it happens. Remove soap buildup in the tub or shower regularly. Attach bath mats securely with double-sided non-slip rug tape. Do not have throw rugs and other things on the floor that can make you trip. What can I do in the bedroom? Use night lights. Make sure that you have a light by your bed that is easy to reach. Do not use any sheets or blankets that are too big for your bed. They should not hang down onto the floor. Have a firm chair that has side arms. You can use this for support while you get dressed. Do not have throw rugs and other things on the floor that can make you trip. What can I do in the kitchen? Clean up any spills right away. Avoid walking on wet floors. Keep items that you use a lot in easy-to-reach places. If you need to reach something above you, use a strong step stool that has a grab bar. Keep electrical cords out of the way. Do not use floor polish or wax that makes floors slippery. If you must use wax, use non-skid floor wax. Do not have throw rugs and other things on the floor that can make you trip. What can I do with my stairs? Do not leave any items on the stairs. Make sure that there are  handrails on both sides of the stairs and use them. Fix handrails that are broken or loose. Make sure that handrails are as long as the stairways. Check any carpeting to make sure that it is firmly attached to the stairs. Fix any carpet that is loose or worn. Avoid having throw rugs at the top or bottom of the stairs. If you do have throw rugs, attach them to the floor with carpet tape. Make sure that you have a light switch at the top of the stairs and the bottom of the stairs. If you do not have them, ask someone to add them for you. What else can I do to help prevent falls? Wear shoes  that: Do not have high heels. Have rubber bottoms. Are comfortable and fit you well. Are closed at the toe. Do not wear sandals. If you use a stepladder: Make sure that it is fully opened. Do not climb a closed stepladder. Make sure that both sides of the stepladder are locked into place. Ask someone to hold it for you, if possible. Clearly mark and make sure that you can see: Any grab bars or handrails. First and last steps. Where the edge of each step is. Use tools that help you move around (mobility aids) if they are needed. These include: Canes. Walkers. Scooters. Crutches. Turn on the lights when you go into a dark area. Replace any light bulbs as soon as they burn out. Set up your furniture so you have a clear path. Avoid moving your furniture around. If any of your floors are uneven, fix them. If there are any pets around you, be aware of where they are. Review your medicines with your doctor. Some medicines can make you feel dizzy. This can increase your chance of falling. Ask your doctor what other things that you can do to help prevent falls. This information is not intended to replace advice given to you by your health care provider. Make sure you discuss any questions you have with your health care provider. Document Released: 09/06/2009 Document Revised: 04/17/2016 Document Reviewed: 12/15/2014 Elsevier Interactive Patient Education  2017 [employer].

## 2021-06-27 NOTE — Progress Notes (Signed)
Subjective:   Kelli Nielsen is a 85 y.o. female who presents for Medicare Annual (Subsequent) preventive examination.  Review of Systems     Cardiac Risk Factors include: advanced age (>16mn, >>58women);family history of premature cardiovascular disease     Objective:    There were no vitals filed for this visit. There is no height or weight on file to calculate BMI.  Advanced Directives 06/27/2021  Does Patient Have a Medical Advance Directive? Yes  Type of Advance Directive Living will;Healthcare Power of Attorney  Does patient want to make changes to medical advance directive? No - Patient declined  Copy of [place]? No - copy requested    Current Medications (verified) Outpatient Encounter Medications as of 06/27/2021  Medication Sig   ALPRAZolam (XANAX) 0.25 MG tablet TAKE 1 TABLET BY MOUTH AT BEDTIME AS NEEDED FOR SLEEP   Probiotic Product (ALIGN) 4 MG CAPS Take 1 capsule by mouth daily.   No facility-administered encounter medications on file as of 06/27/2021.    Allergies (verified) Azithromycin, Penicillins, and Celexa [citalopram hydrobromide]   History: Past Medical History:  Diagnosis Date   Adenomatous colon polyp    Anxiety    Past Surgical History:  Procedure Laterality Date   APPENDECTOMY  1959   peritonitis   BREAST BIOPSY     benign   COLONOSCOPY  2014   negative; Dr KDeatra Ina  TONSILLECTOMY  1959   Family History  Problem Relation Age of Onset   Heart disease Mother        ? MI   Diabetes Neg Hx    Cancer Neg Hx    Stroke Neg Hx    Social History   Socioeconomic History   Marital status: Married    Spouse name: Not on file   Number of children: 1   Years of education: 13   Highest education level: Not on file  Occupational History   Not on file  Tobacco Use   Smoking status: Never   Smokeless tobacco: Never  Vaping Use   Vaping Use: Never used  Substance and Sexual Activity   Alcohol use: Yes     Alcohol/week: 2.0 standard drinks    Types: 2 Glasses of wine per week   Drug use: No   Sexual activity: Not on file  Other Topics Concern   Not on file  Social History Narrative   Fun: Not much fun    Denies abuse and feels safe at home.    Social Determinants of Health   Financial Resource Strain: Low Risk    Difficulty of Paying Living Expenses: Not hard at all  Food Insecurity: No Food Insecurity   Worried About RCharity fundraiserin the Last Year: Never true   [place] the Last Year: Never true  Transportation Needs: No Transportation Needs   Lack of Transportation (Medical): No   Lack of Transportation (Non-Medical): No  Physical Activity: Sufficiently Active   Days of Exercise per Week: 5 days   Minutes of Exercise per Session: 30 min  Stress: No Stress Concern Present   Feeling of Stress : Not at all  Social Connections: Socially Isolated   Frequency of Communication with Friends and Family: More than three times a week   Frequency of Social Gatherings with Friends and Family: Once a week   Attends Religious Services: Never   AMarine scientistor Organizations: No   Attends Club or  Organization Meetings: Never   Marital Status: Divorced    Tobacco Counseling Counseling given: Not Answered   Clinical Intake:  Pre-visit preparation completed: Yes  Pain : No/denies pain     Nutritional Risks: None Diabetes: No  How often do you need to have someone help you when you read instructions, pamphlets, or other written materials from your doctor or pharmacy?: 1 - Never What is the last grade level you completed in school?: [place]; works 40 hours a week with [employer]  Diabetic? no  Interpreter Needed?: No  Information entered by :: Kelli Abu, LPN   Activities of Daily Living In your present state of health, do you have any difficulty performing the following activities: 06/27/2021 09/12/2020  Hearing? N N  Vision? N  N  Difficulty concentrating or making decisions? N N  Walking or climbing stairs? N N  Dressing or bathing? N N  Doing errands, shopping? N N  Preparing Food and eating ? N -  Using the Toilet? N -  In the past six months, have you accidently leaked urine? N -  Do you have problems with loss of bowel control? N -  Managing your Medications? N -  Managing your Finances? N -  Housekeeping or managing your Housekeeping? N -  Some recent data might be hidden    Patient Care Team: Biagio Borg, MD as PCP - General (Internal Medicine) Rutherford Guys, MD as Consulting Physician (Ophthalmology)  Indicate any recent Medical Services you may have received from other than [facility] providers in the past year (date may be approximate).     Assessment:   This is a routine wellness examination for Kelli Nielsen.  Hearing/Vision screen Hearing Screening - Comments:: Patient declined any hearing difficulty. Vision Screening - Comments:: Patient wears contacts.  Eye exams done annually by Dr. Rutherford Guys.  Dietary issues and exercise activities discussed: Current Exercise Habits: Home exercise routine, Type of exercise: walking, Time (Minutes): 30, Frequency (Times/Week): 5, Weekly Exercise (Minutes/Week): 150, Intensity: Moderate, Exercise limited by: orthopedic condition(s)   Goals Addressed               This Visit's Progress     Patient Stated (pt-stated)        To maintain my current health status by continuing to eat healthy and  stay physically active.      Depression Screen PHQ 2/9 Scores 06/27/2021 09/12/2020 09/12/2020 08/26/2019 04/16/2018 02/10/2018 05/01/2017  PHQ - 2 Score 1 1 0 2 0 0 0  PHQ- 9 Score - - 0 - 0 - -    Fall Risk Fall Risk  06/27/2021 09/12/2020 09/12/2020 08/26/2019 11/18/2017  Falls in the past year? 0 0 0 0 No  Number falls in past yr: 0 - 0 - -  Injury with Fall? 0 - 0 - -  Risk for fall due to : No Fall Risks - No Fall Risks - -  Follow up Falls evaluation completed  - Falls evaluation completed - -    FALL RISK PREVENTION PERTAINING TO THE HOME:  Any stairs in or around the home? No  If so, are there any without handrails? No  Home free of loose throw rugs in walkways, pet beds, electrical cords, etc? Yes  Adequate lighting in your home to reduce risk of falls? Yes   ASSISTIVE DEVICES UTILIZED TO PREVENT FALLS:  Life alert? No  Use of a cane, walker or w/c? No  Grab bars in the bathroom? No  Shower chair or bench in shower? No  Elevated toilet seat or a handicapped toilet? No   TIMED UP AND GO:  Was the test performed? No .  Length of time to ambulate 10 feet: 0 sec.   Gait steady and fast without use of assistive device  Cognitive Function: No flowsheet data found.         Immunizations Immunization History  Administered Date(s) Administered   Fluad Quad(high Dose 65+) 07/30/2019, 09/12/2020   Influenza Split 09/02/2011, 09/08/2012   Influenza Whole 10/06/2007, 08/16/2008, 08/21/2009, 09/05/2010   Influenza, High Dose Seasonal PF 08/17/2015, 08/12/2016, 08/18/2017, 08/11/2018   Influenza,inj,Quad PF,6+ Mos 08/15/2014   PFIZER(Purple Top)SARS-COV-2 Vaccination 01/08/2020, 01/31/2020   Pneumococcal Conjugate-13 03/27/2016   Pneumococcal Polysaccharide-23 09/04/2008   Tdap 11/21/2011    TDAP status: Up to date  Flu Vaccine status: Up to date  Pneumococcal vaccine status: Up to date  Covid-19 vaccine status: Completed vaccines  Qualifies for Shingles Vaccine? Yes   Zostavax completed No   Shingrix Completed?: No.    Education has been provided regarding the importance of this vaccine. Patient has been advised to call insurance company to determine out of pocket expense if they have not yet received this vaccine. Advised may also receive vaccine at local pharmacy or Health Dept. Verbalized acceptance and understanding.  Screening Tests Health Maintenance  Topic Date Due   Zoster Vaccines- Shingrix (1 of 2) Never done    COVID-19 Vaccine (3 - Booster for Pfizer series) 07/02/2020   INFLUENZA VACCINE  06/24/2021   TETANUS/TDAP  11/20/2021   DEXA SCAN  Completed   PNA vac Low Risk Adult  Completed   HPV VACCINES  Aged Out    Health Maintenance  Health Maintenance Due  Topic Date Due   Zoster Vaccines- Shingrix (1 of 2) Never done   COVID-19 Vaccine (3 - Booster for Pfizer series) 07/02/2020   INFLUENZA VACCINE  06/24/2021    Colorectal cancer screening: No longer required.   Mammogram status: No longer required due to patient's request.  Bone Density status: never done  Lung Cancer Screening: (Low Dose CT Chest recommended if Age 17-80 years, 30 pack-year currently smoking OR have quit w/in 15years.) does not qualify.   Lung Cancer Screening Referral: no  Additional Screening:  Hepatitis C Screening: does not qualify; Completed no  Vision Screening: Recommended annual ophthalmology exams for early detection of glaucoma and other disorders of the eye. Is the patient up to date with their annual eye exam?  Yes  Who is the provider or what is the name of the office in which the patient attends annual eye exams? Rutherford Guys, MD. If pt is not established with a provider, would they like to be referred to a provider to establish care? No .   Dental Screening: Recommended annual dental exams for proper oral hygiene  Community Resource Referral / Chronic Care Management: CRR required this visit?  No   CCM required this visit?  No      Plan:     I have personally reviewed and noted the following in the patient's chart:   Medical and social history Use of alcohol, tobacco or illicit drugs  Current medications and supplements including opioid prescriptions.  Functional ability and status Nutritional status Physical activity Advanced directives List of other physicians Hospitalizations, surgeries, and ER visits in previous 12 months Vitals Screenings to include cognitive, depression,  and falls Referrals and appointments  In addition, I have reviewed and discussed with patient  certain preventive protocols, quality metrics, and best practice recommendations. A written personalized care plan for preventive services as well as general preventive health recommendations were provided to patient.     Sheral Flow, LPN   [id number]   Nurse Notes:  Patient is cogitatively intact. There were no vitals filed for this visit. There is no height or weight on file to calculate BMI. Patient stated that she has no issues with gait or balance; does not use any assistive devices. Medications reviewed with patient; no opioid use noted.

## 2021-06-28 ENCOUNTER — Ambulatory Visit: Payer: Self-pay

## 2021-06-28 ENCOUNTER — Encounter: Payer: Self-pay | Admitting: Family Medicine

## 2021-06-28 ENCOUNTER — Ambulatory Visit: Payer: Medicare Other | Admitting: Family Medicine

## 2021-06-28 VITALS — BP 132/88 | HR 110 | Ht 68.0 in | Wt 128.0 lb

## 2021-06-28 DIAGNOSIS — G8929 Other chronic pain: Secondary | ICD-10-CM | POA: Diagnosis not present

## 2021-06-28 DIAGNOSIS — M7122 Synovial cyst of popliteal space [Baker], left knee: Secondary | ICD-10-CM

## 2021-06-28 DIAGNOSIS — M25562 Pain in left knee: Secondary | ICD-10-CM | POA: Diagnosis not present

## 2021-06-28 NOTE — Patient Instructions (Signed)
Exercises 3x a week Ice at end of day 20 min Wear compression sleeve See me again in 2 months

## 2021-06-28 NOTE — Assessment & Plan Note (Signed)
Patient does have some mild reaccumulation occurring again.  We discussed the possibility of definitive management.  On ultrasound does appear to have a potential reaccumulation.  Patient also may have an LCL disruption.  Patient will feel like she is not have any instability and would not want any surgical intervention.  Continue the bracing at the moment.  Follow-up with me again in 2 months

## 2021-07-11 DIAGNOSIS — H524 Presbyopia: Secondary | ICD-10-CM | POA: Diagnosis not present

## 2021-08-13 ENCOUNTER — Encounter: Payer: Self-pay | Admitting: Gastroenterology

## 2021-08-27 NOTE — Progress Notes (Signed)
[facility] [place] [facility] [place] Phone: [phone] Subjective:   [name], am serving as a scribe for Dr. Hulan Saas. This visit occurred during the SARS-CoV-2 public health emergency.  Safety protocols were in place, including screening questions prior to the visit, additional usage of staff PPE, and extensive cleaning of exam room while observing appropriate contact time as indicated for disinfecting solutions.     This visit occurred during the SARS-CoV-2 public health emergency.  Safety protocols were in place, including screening questions prior to the visit, additional usage of staff PPE, and extensive cleaning of exam room while observing appropriate contact time as indicated for disinfecting solutions.   I'm seeing this patient by the request  of:  Biagio Borg, MD  CC: knee pain follow up   [id number]  06/28/2021 Patient does have some mild reaccumulation occurring again.  We discussed the possibility of definitive management.  On ultrasound does appear to have a potential reaccumulation.  Patient also may have an LCL disruption.  Patient will feel like she is not have any instability and would not want any surgical intervention.  Continue the bracing at the moment.  Follow-up with me again in 2 months  Updated 08/28/2021 Kelli Nielsen is a 85 y.o. female coming in with complaint of left knee pain there was a concern that patient did have the potential for an LCL disruption.  Patient's x-rays from June 2022 were independently visualized by me showing the patient does have mild to moderate tricompartmental osteoarthritic changes with a small joint effusion.  Patient having difficulty with sit to stand and the first few steps after standing up. Overall feels that there has been some improvement      Past Medical History:  Diagnosis Date   Adenomatous colon polyp    Anxiety    Past Surgical History:  Procedure  Laterality Date   APPENDECTOMY  1959   peritonitis   BREAST BIOPSY     benign   COLONOSCOPY  2014   negative; Dr Deatra Ina   TONSILLECTOMY  1959   Social History   Socioeconomic History   Marital status: Married    Spouse name: Not on file   Number of children: 1   Years of education: 13   Highest education level: Not on file  Occupational History   Not on file  Tobacco Use   Smoking status: Never   Smokeless tobacco: Never  Vaping Use   Vaping Use: Never used  Substance and Sexual Activity   Alcohol use: Yes    Alcohol/week: 2.0 standard drinks    Types: 2 Glasses of wine per week   Drug use: No   Sexual activity: Not on file  Other Topics Concern   Not on file  Social History Narrative   Fun: Not much fun    Denies abuse and feels safe at home.    Social Determinants of Health   Financial Resource Strain: Low Risk    Difficulty of Paying Living Expenses: Not hard at all  Food Insecurity: No Food Insecurity   Worried About Charity fundraiser in the Last Year: Never true   [place] in the Last Year: Never true  Transportation Needs: No Transportation Needs   Lack of Transportation (Medical): No   Lack of Transportation (Non-Medical): No  Physical Activity: Sufficiently Active   Days of Exercise per Week: 5 days   Minutes of Exercise per Session: 30  min  Stress: No Stress Concern Present   Feeling of Stress : Not at all  Social Connections: Socially Isolated   Frequency of Communication with Friends and Family: More than three times a week   Frequency of Social Gatherings with Friends and Family: Once a week   Attends Religious Services: Never   Marine scientist or Organizations: No   Attends Music therapist: Never   Marital Status: Divorced   Allergies  Allergen Reactions   Azithromycin     REACTION: elevated liver enzymes   Penicillins Hives    Hives    Celexa [Citalopram Hydrobromide]    Family History  Problem Relation  Age of Onset   Heart disease Mother        ? MI   Diabetes Neg Hx    Cancer Neg Hx    Stroke Neg Hx          Current Outpatient Medications (Other):    ALPRAZolam (XANAX) 0.25 MG tablet, TAKE 1 TABLET BY MOUTH AT BEDTIME AS NEEDED FOR SLEEP   Probiotic Product (ALIGN) 4 MG CAPS, Take 1 capsule by mouth daily.    Review of Systems:  No headache, visual changes, nausea, vomiting, diarrhea, constipation, dizziness, abdominal pain, skin rash, fevers, chills, night sweats, weight loss, swollen lymph nodes, body aches, joint swelling, chest pain, shortness of breath, mood changes. POSITIVE muscle aches  Objective  Blood pressure 130/78, height 5\' 8"  (1.727 m), weight 130 lb (59 kg).   General: No apparent distress alert and oriented x3 mood and affect normal, dressed appropriately.  HEENT: Pupils equal, extraocular movements intact  Respiratory: Patient's speak in full sentences and does not appear short of breath  Cardiovascular: No lower extremity edema, non tender, no erythema  Gait normal with good balance and coordination.  MSK: Patient does have some swelling in the popliteal area on the left knee again.  Patient is tender to palpation in this area.  Mild instability noted with valgus and varus force.  Procedure: Real-time Ultrasound Guided Injection of left knee Baker's cyst Device: GE Logiq Q7 Ultrasound guided injection is preferred based studies that show increased duration, increased effect, greater accuracy, decreased procedural pain, increased response rate, and decreased cost with ultrasound guided versus blind injection.  Verbal informed consent obtained.  Time-out conducted.  Noted no overlying erythema, induration, or other signs of local infection.  Skin prepped in a sterile fashion.  Local anesthesia: Topical Ethyl chloride.  With sterile technique and under real time ultrasound guidance: With a 22-gauge 2 inch needle patient was injected with 4 cc of 0.5%  Marcaine and aspirated 20 cc of straw light-colored fluid then injected 1 cc of Kenalog 40 mg/dL. This was from a posterior Completed without difficulty  Pain immediately resolved suggesting accurate placement of the medication.  Advised to call if fevers/chills, erythema, induration, drainage, or persistent bleeding.  Impression: Technically successful ultrasound guided injection.    Impression and Recommendations:     The above documentation has been reviewed and is accurate and complete Lyndal Pulley, DO

## 2021-08-28 ENCOUNTER — Ambulatory Visit: Payer: Medicare Other | Admitting: Family Medicine

## 2021-08-28 ENCOUNTER — Other Ambulatory Visit: Payer: Self-pay

## 2021-08-28 ENCOUNTER — Encounter: Payer: Self-pay | Admitting: Family Medicine

## 2021-08-28 ENCOUNTER — Ambulatory Visit: Payer: Self-pay

## 2021-08-28 VITALS — BP 130/78 | Ht 68.0 in | Wt 130.0 lb

## 2021-08-28 DIAGNOSIS — M25562 Pain in left knee: Secondary | ICD-10-CM | POA: Diagnosis not present

## 2021-08-28 DIAGNOSIS — M7122 Synovial cyst of popliteal space [Baker], left knee: Secondary | ICD-10-CM | POA: Diagnosis not present

## 2021-08-28 NOTE — Assessment & Plan Note (Signed)
Repeat aspiration was done today.  Patient does have underlying arthritic changes and also contributing.  Discussed with patient icing regimen and home exercises.  Discussed the possibility of further evaluation with the possibility of an MRI.  Discussed with patient though that if there is a chance for an arthroscopic procedure the MRI would be a possibility.  Also change so that it would show significant arthritic changes in the knee but unfortunately it would not be a candidate for it.  Patient at the moment though feels that the aspiration has been very helpful.  We discussed icing regimen and home exercises.  Discussed compression and bracing.  Follow-up again in 2 months

## 2021-08-28 NOTE — Patient Instructions (Signed)
Drained cyst today See you again in 2-3 months

## 2021-08-31 ENCOUNTER — Ambulatory Visit (INDEPENDENT_AMBULATORY_CARE_PROVIDER_SITE_OTHER): Payer: Medicare Other

## 2021-08-31 DIAGNOSIS — Z23 Encounter for immunization: Secondary | ICD-10-CM

## 2021-10-24 ENCOUNTER — Encounter: Payer: Self-pay | Admitting: Internal Medicine

## 2021-10-24 ENCOUNTER — Ambulatory Visit: Payer: Medicare Other | Admitting: Internal Medicine

## 2021-10-24 ENCOUNTER — Other Ambulatory Visit: Payer: Self-pay

## 2021-10-24 VITALS — BP 136/78 | HR 87 | Temp 98.0°F | Ht 68.0 in | Wt 129.0 lb

## 2021-10-24 DIAGNOSIS — F418 Other specified anxiety disorders: Secondary | ICD-10-CM | POA: Diagnosis not present

## 2021-10-24 DIAGNOSIS — J309 Allergic rhinitis, unspecified: Secondary | ICD-10-CM | POA: Diagnosis not present

## 2021-10-24 DIAGNOSIS — E559 Vitamin D deficiency, unspecified: Secondary | ICD-10-CM

## 2021-10-24 DIAGNOSIS — R739 Hyperglycemia, unspecified: Secondary | ICD-10-CM

## 2021-10-24 DIAGNOSIS — Z0001 Encounter for general adult medical examination with abnormal findings: Secondary | ICD-10-CM | POA: Diagnosis not present

## 2021-10-24 LAB — BASIC METABOLIC PANEL
BUN: 17 mg/dL (ref 6–23)
CO2: 29 mEq/L (ref 19–32)
Calcium: 10.2 mg/dL (ref 8.4–10.5)
Chloride: 103 mEq/L (ref 96–112)
Creatinine, Ser: 0.63 mg/dL (ref 0.40–1.20)
GFR: 81.96 mL/min (ref >60.00)
Glucose, Bld: 99 mg/dL (ref 70–99)
Potassium: 4.3 mEq/L (ref 3.5–5.1)
Sodium: 140 mEq/L (ref 135–145)

## 2021-10-24 LAB — CBC WITH DIFFERENTIAL/PLATELET
Basophils Absolute: 0 10*3/uL (ref 0.0–0.1)
Basophils Relative: 0.7 % (ref 0.0–3.0)
Eosinophils Absolute: 0 10*3/uL (ref 0.0–0.7)
Eosinophils Relative: 0.6 % (ref 0.0–5.0)
HCT: 43.1 % (ref 36.0–46.0)
Hemoglobin: 14.4 g/dL (ref 12.0–15.0)
Lymphocytes Relative: 26.5 % (ref 12.0–46.0)
Lymphs Abs: 1.2 10*3/uL (ref 0.7–4.0)
MCHC: 33.5 g/dL (ref 30.0–36.0)
MCV: 102.7 fl — ABNORMAL HIGH (ref 78.0–100.0)
Monocytes Absolute: 0.4 10*3/uL (ref 0.1–1.0)
Monocytes Relative: 7.9 % (ref 3.0–12.0)
Neutro Abs: 2.9 10*3/uL (ref 1.4–7.7)
Neutrophils Relative %: 64.3 % (ref 43.0–77.0)
Platelets: 238 10*3/uL (ref 150.0–400.0)
RBC: 4.2 Mil/uL (ref 3.87–5.11)
RDW: 13 % (ref 11.5–15.5)
WBC: 4.5 10*3/uL (ref 4.0–10.5)

## 2021-10-24 LAB — URINALYSIS, ROUTINE W REFLEX MICROSCOPIC
Bilirubin Urine: NEGATIVE
Hgb urine dipstick: NEGATIVE
Ketones, ur: NEGATIVE
Leukocytes,Ua: NEGATIVE
Nitrite: NEGATIVE
RBC / HPF: NONE SEEN (ref 0–?)
Specific Gravity, Urine: 1.025 (ref 1.000–1.030)
Total Protein, Urine: NEGATIVE
Urine Glucose: NEGATIVE
Urobilinogen, UA: 0.2 (ref 0.0–1.0)
pH: 5.5 (ref 5.0–8.0)

## 2021-10-24 LAB — LIPID PANEL
Cholesterol: 190 mg/dL (ref 0–200)
HDL: 97.6 mg/dL (ref >39.00)
LDL Cholesterol: 80 mg/dL (ref 0–99)
NonHDL: 92.86
Total CHOL/HDL Ratio: 2
Triglycerides: 63 mg/dL (ref 0.0–149.0)
VLDL: 12.6 mg/dL (ref 0.0–40.0)

## 2021-10-24 LAB — HEPATIC FUNCTION PANEL
ALT: 15 U/L (ref 0–35)
AST: 21 U/L (ref 0–37)
Albumin: 4.4 g/dL (ref 3.5–5.2)
Alkaline Phosphatase: 45 U/L (ref 39–117)
Bilirubin, Direct: 0.1 mg/dL (ref 0.0–0.3)
Total Bilirubin: 0.7 mg/dL (ref 0.2–1.2)
Total Protein: 7.2 g/dL (ref 6.0–8.3)

## 2021-10-24 LAB — HEMOGLOBIN A1C: Hgb A1c MFr Bld: 5.5 % (ref 4.6–6.5)

## 2021-10-24 LAB — TSH: TSH: 1.02 u[IU]/mL (ref 0.35–5.50)

## 2021-10-24 LAB — VITAMIN D 25 HYDROXY (VIT D DEFICIENCY, FRACTURES): VITD: 56.77 ng/mL (ref 30.00–100.00)

## 2021-10-24 MED ORDER — FLUOXETINE HCL 10 MG PO CAPS: 10.0000 mg | ORAL_CAPSULE | Freq: Every day | ORAL | 3 refills | Status: DC

## 2021-10-24 MED ORDER — ALPRAZOLAM 0.25 MG PO TABS: 0.2500 mg | ORAL_TABLET | Freq: Every evening | ORAL | 5 refills | Status: DC | PRN

## 2021-10-24 NOTE — Progress Notes (Signed)
Patient ID: Kelli Nielsen, female   DOB: [birth year], 85 y.o.   MRN: [id number]         Chief Complaint:: wellness exam and depression, allergies, low vit d       HPI:  Kelli Nielsen is a 85 y.o. female here for wellness exam; decliens covid booster, shingrix, o/w up to date                        Also Does have several wks ongoing nasal allergy symptoms with clearish congestion, itch and sneezing, without fever, pain, ST, cough, swelling or wheezing.  Also with 1 mo mild worsening depressive symptoms, but no suicidal ideation, or panic; has ongoing anxiety.  Not taking Vit d.  Pt denies chest pain, increased sob or doe, wheezing, orthopnea, PND, increased LE swelling, palpitations, dizziness or syncope.   Pt denies polydipsia, polyuria, or new focal neuro s/s.   Pt denies fever, wt loss, night sweats, loss of appetite, or other constitutional symptoms     Wt Readings from Last 3 Encounters:  10/24/21 129 lb (58.5 kg)  08/28/21 130 lb (59 kg)  06/28/21 128 lb (58.1 kg)   BP Readings from Last 3 Encounters:  10/24/21 136/78  08/28/21 130/78  06/28/21 132/88   Immunization History  Administered Date(s) Administered   Fluad Quad(high Dose 65+) 07/30/2019, 09/12/2020, 08/31/2021   Influenza Split 09/02/2011, 09/08/2012   Influenza Whole 10/06/2007, 08/16/2008, 08/21/2009, 09/05/2010   Influenza, High Dose Seasonal PF 08/17/2015, 08/12/2016, 08/18/2017, 08/11/2018   Influenza,inj,Quad PF,6+ Mos 08/15/2014   PFIZER(Purple Top)SARS-COV-2 Vaccination 01/08/2020, 01/31/2020, 11/21/2020   Pneumococcal Conjugate-13 03/27/2016   Pneumococcal Polysaccharide-23 09/04/2008   Tdap 11/21/2011   There are no preventive care reminders to display for this patient.     Past Medical History:  Diagnosis Date   Adenomatous colon polyp    Anxiety    Past Surgical History:  Procedure Laterality Date   APPENDECTOMY  1959   peritonitis   BREAST BIOPSY     benign   COLONOSCOPY  2014   negative; Dr  Deatra Ina   TONSILLECTOMY  1959    reports that she has never smoked. She has never used smokeless tobacco. She reports current alcohol use of about 2.0 standard drinks per week. She reports that she does not use drugs. family history includes Heart disease in her mother. Allergies  Allergen Reactions   Azithromycin     REACTION: elevated liver enzymes   Penicillins Hives    Hives    Celexa [Citalopram Hydrobromide]    Current Outpatient Medications on File Prior to Visit  Medication Sig Dispense Refill   Probiotic Product (ALIGN) 4 MG CAPS Take 1 capsule by mouth daily.     No current facility-administered medications on file prior to visit.        ROS:  All others reviewed and negative.  Objective        PE:  BP 136/78 (BP Location: Left Arm, Patient Position: Sitting, Cuff Size: Normal)   Pulse 87   Temp 98 F (36.7 C) (Oral)   Ht 5\' 8"  (1.727 m)   Wt 129 lb (58.5 kg)   SpO2 97%   BMI 19.61 kg/m                 Constitutional: Pt appears in NAD               HENT: Head: NCAT.  Right Ear: External ear normal.                 Left Ear: External ear normal.  Bilat tm's with mild erythema.  Max sinus areas non tender.  Pharynx with mild erythema, no exudate               Eyes: . Pupils are equal, round, and reactive to light. Conjunctivae and EOM are normal               Nose: without d/c or deformity               Neck: Neck supple. Gross normal ROM               Cardiovascular: Normal rate and regular rhythm.                 Pulmonary/Chest: Effort normal and breath sounds without rales or wheezing.                Abd:  Soft, NT, ND, + BS, no organomegaly               Neurological: Pt is alert. At baseline orientation, motor grossly intact               Skin: Skin is warm. No rashes, no other new lesions, LE edema - none               Psychiatric: Pt behavior is normal without agitation with depressed anxious affect  Micro: none  Cardiac tracings I have  personally interpreted today:  none  Pertinent Radiological findings (summarize): none   Lab Results  Component Value Date   WBC 4.5 10/24/2021   HGB 14.4 10/24/2021   HCT 43.1 10/24/2021   PLT 238.0 10/24/2021   GLUCOSE 99 10/24/2021   CHOL 190 10/24/2021   TRIG 63.0 10/24/2021   HDL 97.60 10/24/2021   LDLCALC 80 10/24/2021   ALT 15 10/24/2021   AST 21 10/24/2021   NA 140 10/24/2021   K 4.3 10/24/2021   CL 103 10/24/2021   CREATININE 0.63 10/24/2021   BUN 17 10/24/2021   CO2 29 10/24/2021   TSH 1.02 10/24/2021   HGBA1C 5.5 10/24/2021   Assessment/Plan:  Kelli Nielsen is a 85 y.o. White or Caucasian [1] female with  has a past medical history of Adenomatous colon polyp and Anxiety.  Vitamin D deficiency Last vitamin D Lab Results  Component Value Date   VD25OH 20.63 (L) 09/14/2020   Low, to start oral replacement   Encounter for well adult exam with abnormal findings Age and sex appropriate education and counseling updated with regular exercise and diet Referrals for preventative services - none needed Immunizations addressed - declines covid booster and shingirx Smoking counseling  - none needed Evidence for depression or other mood disorder - worsening depression, anxiety for prozac 10 Most recent labs reviewed. I have personally reviewed and have noted: 1) the patient's medical and social history 2) The patient's current medications and supplements 3) The patient's height, weight, and BMI have been recorded in the chart   Depression with anxiety Mild worsening, no SI or HI, for prozac 10 qd, declines referral counseling  Allergic rhinitis Mild to mod worsening, for allegra and nasaocrt asd,  to f/u any worsening symptoms or concerns  Hyperglycemia Lab Results  Component Value Date   HGBA1C 5.5 10/24/2021   Stable, pt to continue current medical treatment  - diet  Followup: Return  in about 1 year (around 10/24/2022).  [name], MD 10/29/2021 11:13  PM [facility]

## 2021-10-24 NOTE — Assessment & Plan Note (Signed)
Last vitamin D Lab Results  Component Value Date   VD25OH 20.63 (L) 09/14/2020   Low, to start oral replacement

## 2021-10-24 NOTE — Patient Instructions (Signed)
Please take all new medication as prescribed - the prozac 10 mg per day  Please continue all other medications as before, and refills have been done if requested.  Please have the pharmacy call with any other refills you may need.  Please continue your efforts at being more active, low cholesterol diet, and weight control.  You are otherwise up to date with prevention measures today.  Please keep your appointments with your specialists as you may have planned  Please go to the LAB at the blood drawing area for the tests to be done  You will be contacted by phone if any changes need to be made immediately.  Otherwise, you will receive a letter about your results with an explanation, but please check with MyChart first.  Please remember to sign up for MyChart if you have not done so, as this will be important to you in the future with finding out test results, communicating by private email, and scheduling acute appointments online when needed.  Please make an Appointment to return for your 1 year visit, or sooner if needed

## 2021-10-25 ENCOUNTER — Telehealth: Payer: Self-pay | Admitting: Internal Medicine

## 2021-10-25 MED ORDER — FLUOXETINE HCL 10 MG PO TABS: 10.0000 mg | ORAL_TABLET | Freq: Every day | ORAL | 3 refills | Status: DC

## 2021-10-25 NOTE — Telephone Encounter (Signed)
Very sorry -   The capsule we sent is not  the $4 [facility] list BUT the tablet is on the list  I changed the prozac 10 mg from capsule to TABLET which should be $4  -

## 2021-10-25 NOTE — Telephone Encounter (Signed)
Patient calling in  Patient had OV 12/01 & was prescribed FLUoxetine (PROZAC) 10 MG capsule  Patient says when she went to pick up from pharmacy the oop was a bit much  Would like a call back from nurse to discuss possibly switching to another medication

## 2021-10-29 ENCOUNTER — Encounter: Payer: Self-pay | Admitting: Internal Medicine

## 2021-10-29 NOTE — Assessment & Plan Note (Signed)
Age and sex appropriate education and counseling updated with regular exercise and diet Referrals for preventative services - none needed Immunizations addressed - declines covid booster and shingirx Smoking counseling  - none needed Evidence for depression or other mood disorder - worsening depression, anxiety for prozac 10 Most recent labs reviewed. I have personally reviewed and have noted: 1) the patient's medical and social history 2) The patient's current medications and supplements 3) The patient's height, weight, and BMI have been recorded in the chart

## 2021-10-29 NOTE — Assessment & Plan Note (Signed)
Mild to mod worsening, for allegra and nasaocrt asd,  to f/u any worsening symptoms or concerns

## 2021-10-29 NOTE — Assessment & Plan Note (Signed)
Mild worsening, no SI or HI, for prozac 10 qd, declines referral counseling

## 2021-10-29 NOTE — Assessment & Plan Note (Signed)
Lab Results  Component Value Date   HGBA1C 5.5 10/24/2021   Stable, pt to continue current medical treatment  - diet

## 2021-11-28 ENCOUNTER — Ambulatory Visit: Payer: Medicare Other | Admitting: Family Medicine

## 2021-12-12 ENCOUNTER — Ambulatory Visit: Payer: Medicare Other | Admitting: Family Medicine

## 2021-12-26 ENCOUNTER — Ambulatory Visit: Payer: Medicare Other | Admitting: Family Medicine

## 2022-01-20 ENCOUNTER — Ambulatory Visit: Payer: Medicare Other | Admitting: Family Medicine

## 2022-01-29 ENCOUNTER — Encounter: Payer: Self-pay | Admitting: Internal Medicine

## 2022-01-29 ENCOUNTER — Other Ambulatory Visit: Payer: Self-pay

## 2022-01-29 ENCOUNTER — Ambulatory Visit: Payer: Medicare Other | Admitting: Internal Medicine

## 2022-01-29 VITALS — BP 146/78 | HR 80 | Temp 98.1°F | Resp 16 | Ht 68.0 in | Wt 131.0 lb

## 2022-01-29 DIAGNOSIS — R030 Elevated blood-pressure reading, without diagnosis of hypertension: Secondary | ICD-10-CM | POA: Diagnosis not present

## 2022-01-29 NOTE — Progress Notes (Signed)
? ?Subjective:  ?Patient ID: Kelli Nielsen, female    DOB: [birth year]  Age: 86 y.o. MRN: [id number] ? ?CC: Hypertension ? ?This visit occurred during the SARS-CoV-2 public health emergency.  Safety protocols were in place, including screening questions prior to the visit, additional usage of staff PPE, and extensive cleaning of exam room while observing appropriate contact time as indicated for disinfecting solutions.   ? ?HPI ?[name] presents for f/up - ? ?She went to see a dentist yesterday for a dental implant but was told that her SBP was 200 so the procedure was canceled. She had taken an OTC "allergy" pill, felt anxious, and did not sleep well the prior night. She feels well. ? ?Outpatient Medications Prior to Visit  ?Medication Sig Dispense Refill  ? ALPRAZolam (XANAX) 0.25 MG tablet Take 1 tablet (0.25 mg total) by mouth at bedtime as needed. for sleep 30 tablet 5  ? FLUoxetine (PROZAC) 10 MG tablet Take 1 tablet (10 mg total) by mouth daily. 90 tablet 3  ? Probiotic Product (ALIGN) 4 MG CAPS Take 1 capsule by mouth daily.    ? ?No facility-administered medications prior to visit.  ? ? ?ROS ?Review of Systems  ?Constitutional: Negative.  Negative for diaphoresis and fatigue.  ?HENT: Negative.    ?Eyes: Negative.   ?Respiratory: Negative.  Negative for cough, chest tightness, shortness of breath and wheezing.   ?Cardiovascular:  Negative for chest pain, palpitations and leg swelling.  ?Gastrointestinal: Negative.  Negative for abdominal pain, diarrhea, nausea and vomiting.  ?Genitourinary:  Negative for flank pain and hematuria.  ?Musculoskeletal: Negative.  Negative for arthralgias and myalgias.  ?Skin: Negative.   ?Neurological:  Negative for dizziness, weakness, light-headedness and headaches.  ?Hematological:  Negative for adenopathy. Does not bruise/bleed easily.  ?Psychiatric/Behavioral: Negative.    ? ?Objective:  ?BP (!) 146/78 (BP Location: Right Arm, Patient Position: Sitting, Cuff Size: Normal)    Pulse 80   Temp 98.1 ?F (36.7 ?C) (Oral)   Resp 16   Ht '5\' 8"'$  (1.727 m)   Wt 131 lb (59.4 kg)   SpO2 99%   BMI 19.92 kg/m?  ? ?BP Readings from Last 3 Encounters:  ?01/29/22 (!) 146/78  ?10/24/21 136/78  ?08/28/21 130/78  ? ? ?Wt Readings from Last 3 Encounters:  ?01/29/22 131 lb (59.4 kg)  ?10/24/21 129 lb (58.5 kg)  ?08/28/21 130 lb (59 kg)  ? ? ?Physical Exam ?Vitals reviewed.  ?HENT:  ?   Nose: Nose normal.  ?   Mouth/Throat:  ?   Mouth: Mucous membranes are moist.  ?Eyes:  ?   General: No scleral icterus. ?   Conjunctiva/sclera: Conjunctivae normal.  ?Cardiovascular:  ?   Rate and Rhythm: Normal rate and regular rhythm.  ?   Heart sounds: No murmur heard. ?Pulmonary:  ?   Effort: Pulmonary effort is normal.  ?   Breath sounds: No stridor. No wheezing, rhonchi or rales.  ?Abdominal:  ?   General: Abdomen is flat.  ?   Palpations: There is no mass.  ?   Tenderness: There is no abdominal tenderness. There is no guarding.  ?   Hernia: No hernia is present.  ?Musculoskeletal:  ?   Cervical back: Neck supple.  ?   Right lower leg: No edema.  ?   Left lower leg: No edema.  ?Lymphadenopathy:  ?   Cervical: No cervical adenopathy.  ?Skin: ?   General: Skin is warm and dry.  ?Neurological:  ?  General: No focal deficit present.  ?   Mental Status: She is alert. Mental status is at baseline.  ?Psychiatric:     ?   Mood and Affect: Mood normal.     ?   Behavior: Behavior normal.  ? ? ?Lab Results  ?Component Value Date  ? WBC 4.5 10/24/2021  ? HGB 14.4 10/24/2021  ? HCT 43.1 10/24/2021  ? PLT 238.0 10/24/2021  ? GLUCOSE 99 10/24/2021  ? CHOL 190 10/24/2021  ? TRIG 63.0 10/24/2021  ? HDL 97.60 10/24/2021  ? [place] 80 10/24/2021  ? ALT 15 10/24/2021  ? AST 21 10/24/2021  ? NA 140 10/24/2021  ? K 4.3 10/24/2021  ? CL 103 10/24/2021  ? CREATININE 0.63 10/24/2021  ? BUN 17 10/24/2021  ? CO2 29 10/24/2021  ? TSH 1.02 10/24/2021  ? HGBA1C 5.5 10/24/2021  ? ? ?DG Chest 2 View ? ?Result Date: 11/07/2014 ?CLINICAL DATA:   Productive cough and fever for 3 days; no history of tobacco use EXAM: CHEST  2 VIEW COMPARISON:  PA and lateral chest of May 10, 2013 FINDINGS: The lungs are mildly hyperinflated. There are increased infrahilar lung markings bilaterally predominantly anteriorly there is no alveolar infiltrate. There is no pleural effusion or pneumothorax. The heart and pulmonary vascularity are within the limits of normal. There is mild tortuosity of the descending thoracic aorta. The bony thorax is unremarkable. IMPRESSION: Mild hyperinflation likely reflects underlying reactive airway disease. Increased infrahilar lung markings are consistent with acute bronchitic change or minimal subsegmental atelectasis. Follow-up films following anticipated antibiotic therapy are recommended to assure clearing. Electronically Signed   By: David  Martinique   On: 11/07/2014 13:46  ? ? ?Assessment & Plan:  ? ?Loura was seen today for hypertension. ? ?Diagnoses and all orders for this visit: ? ?Elevated blood pressure reading without diagnosis of hypertension- Her BP is normal today. I think the brief elevation was a constellation of secondary factors. I do not think she needs an anti-hypertensive medication. I offered her reassurance. ? ? ?I am having Kierstin B. Rease maintain her Align, ALPRAZolam, and FLUoxetine. ? ?No orders of the defined types were placed in this encounter. ? ? ? ?Follow-up: Return in about 6 months (around 08/01/2022). ? ?Scarlette Calico, MD ?

## 2022-01-29 NOTE — Patient Instructions (Signed)

## 2022-02-13 ENCOUNTER — Ambulatory Visit: Payer: Medicare Other | Admitting: Family Medicine

## 2022-03-14 NOTE — Progress Notes (Signed)
?Charlann Boxer D.O. ?[facility] ?[place] ?Phone: [phone] ?Subjective:   ?I, Jacqualin Combes, am serving as a scribe for Dr. Hulan Saas. ? ?This visit occurred during the SARS-CoV-2 public health emergency.  Safety protocols were in place, including screening questions prior to the visit, additional usage of staff PPE, and extensive cleaning of exam room while observing appropriate contact time as indicated for disinfecting solutions.  ? ?I'm seeing this patient by the request  of:  Biagio Borg, MD ? ?CC: left knee pain  ? ?[id number]  ?08/28/2021 ?Repeat aspiration was done today.  Patient does have underlying arthritic changes and also contributing.  Discussed with patient icing regimen and home exercises.  Discussed the possibility of further evaluation with the possibility of an MRI.  Discussed with patient though that if there is a chance for an arthroscopic procedure the MRI would be a possibility.  Also change so that it would show significant arthritic changes in the knee but unfortunately it would not be a candidate for it.  Patient at the moment though feels that the aspiration has been very helpful.  We discussed icing regimen and home exercises.  Discussed compression and bracing.  Follow-up again in 2 months ? ?Update 03/17/2022 ?Kelli Nielsen is a 86 y.o. female coming in with complaint of L knee pain.  Patient was last seen in October and had a large Baker's cyst.  Did have aspiration done.  Patient states that she does not have pain. Feels like she might have swelling. Patient wears brace when she gets swelling. Here to see if she has cyst again in back of knee.  ? ? ?  ? ?Past Medical History:  ?Diagnosis Date  ? Adenomatous colon polyp   ? Anxiety   ? ?Past Surgical History:  ?Procedure Laterality Date  ? APPENDECTOMY  1959  ? peritonitis  ? BREAST BIOPSY    ? benign  ? COLONOSCOPY  2014  ? negative; Dr Deatra Ina  ? TONSILLECTOMY  1959  ? ?Social  History  ? ?Socioeconomic History  ? Marital status: Married  ?  Spouse name: Not on file  ? Number of children: 1  ? Years of education: 64  ? Highest education level: Not on file  ?Occupational History  ? Not on file  ?Tobacco Use  ? Smoking status: Never  ? Smokeless tobacco: Never  ?Vaping Use  ? Vaping Use: Never used  ?Substance and Sexual Activity  ? Alcohol use: Yes  ?  Alcohol/week: 2.0 standard drinks  ?  Types: 2 Glasses of wine per week  ? Drug use: No  ? Sexual activity: Not on file  ?Other Topics Concern  ? Not on file  ?Social History Narrative  ? Fun: Not much fun   ? Denies abuse and feels safe at home.   ? ?Social Determinants of Health  ? ?Financial Resource Strain: Low Risk   ? Difficulty of Paying Living Expenses: Not hard at all  ?Food Insecurity: No Food Insecurity  ? Worried About Charity fundraiser in the Last Year: Never true  ? Ran Out of Food in the Last Year: Never true  ?Transportation Needs: No Transportation Needs  ? Lack of Transportation (Medical): No  ? Lack of Transportation (Non-Medical): No  ?Physical Activity: Sufficiently Active  ? Days of Exercise per Week: 5 days  ? Minutes of Exercise per Session: 30 min  ?Stress: No Stress Concern Present  ? Feeling of  Stress : Not at all  ?Social Connections: Socially Isolated  ? Frequency of Communication with Friends and Family: More than three times a week  ? Frequency of Social Gatherings with Friends and Family: Once a week  ? Attends Religious Services: Never  ? Active Member of Clubs or Organizations: No  ? Attends Archivist Meetings: Never  ? Marital Status: Divorced  ? ?Allergies  ?Allergen Reactions  ? Azithromycin   ?  REACTION: elevated liver enzymes  ? Penicillins Hives  ?  Hives ?  ? Celexa [Citalopram Hydrobromide]   ? ?Family History  ?Problem Relation Age of Onset  ? Heart disease Mother   ?     ? MI  ? Diabetes Neg Hx   ? Cancer Neg Hx   ? Stroke Neg Hx   ? ? ? ? ? ? ? ?Current Outpatient Medications (Other):   ?  ALPRAZolam (XANAX) 0.25 MG tablet, Take 1 tablet (0.25 mg total) by mouth at bedtime as needed. for sleep ?  FLUoxetine (PROZAC) 10 MG tablet, Take 1 tablet (10 mg total) by mouth daily. ?  Probiotic Product (ALIGN) 4 MG CAPS, Take 1 capsule by mouth daily. ? ? ? ?Objective  ?Blood pressure (!) 156/82, pulse 86, height '5\' 8"'$  (1.727 m), weight 130 lb (59 kg), SpO2 97 %. ?  ?General: No apparent distress alert and oriented x3 mood and affect normal, dressed appropriately.  ?HEENT: Pupils equal, extraocular movements intact  ?Respiratory: Patient's speak in full sentences and does not appear short of breath  ?Cardiovascular: No lower extremity edema, non tender, no erythema  ?Gait normal with good balance and coordination.  ?MSK: Left knee exam shows arthritis but only mild swelling in polpliteal area, full ROM  ? ? ?Limited muscular skeletal ultrasound was performed and interpreted by Hulan Saas, M  ?Mild re accumulation of the backer cyst, not taught no sign of infection.  ?Impression baker cyst  ?  ?Impression and Recommendations:  ?  ? ?The above documentation has been reviewed and is accurate and complete Lyndal Pulley, DO ? ? ? ?

## 2022-03-17 ENCOUNTER — Encounter: Payer: Self-pay | Admitting: Family Medicine

## 2022-03-17 ENCOUNTER — Ambulatory Visit: Payer: Self-pay

## 2022-03-17 ENCOUNTER — Ambulatory Visit: Payer: Medicare Other | Admitting: Family Medicine

## 2022-03-17 VITALS — BP 156/82 | HR 86 | Ht 68.0 in | Wt 130.0 lb

## 2022-03-17 DIAGNOSIS — M25562 Pain in left knee: Secondary | ICD-10-CM

## 2022-03-17 NOTE — Patient Instructions (Signed)
See me again in 3 months ?Call us if it gets painful ?

## 2022-06-23 ENCOUNTER — Ambulatory Visit: Payer: Medicare Other | Admitting: Family Medicine

## 2022-07-01 ENCOUNTER — Ambulatory Visit (INDEPENDENT_AMBULATORY_CARE_PROVIDER_SITE_OTHER): Payer: Medicare Other

## 2022-07-01 DIAGNOSIS — Z0000 Encounter for general adult medical examination without abnormal findings: Secondary | ICD-10-CM

## 2022-07-01 NOTE — Patient Instructions (Signed)
Kelli Nielsen , Thank you for taking time to come for your Medicare Wellness Visit. I appreciate your ongoing commitment to your health goals. Please review the following plan we discussed and let me know if I can assist you in the future.   Screening recommendations/referrals: Colonoscopy: Discontinued due to age 86: Discontinued due to age Bone Density: Never done Recommended yearly ophthalmology/optometry visit for glaucoma screening and checkup Recommended yearly dental visit for hygiene and checkup  Vaccinations: Influenza vaccine: 08/31/2021 Pneumococcal vaccine: 09/04/2008, 03/27/2016 Tdap vaccine: 11/21/2011; due every 10 years Shingles vaccine: never done   Covid-19: 01/08/2020, 01/31/2020, 11/21/2020  Advanced directives: Yes; Please bring a copy of your health care power of attorney and living will to the office at your convenience.  Conditions/risks identified: Yes  Next appointment: Please schedule your next Medicare Wellness Visit with Nurse Mignon Pine within 1 year by calling [phone].    Preventive Care 109 Years and Older, Female Preventive care refers to lifestyle choices and visits with your health care provider that can promote health and wellness. What does preventive care include? A yearly physical exam. This is also called an annual well check. Dental exams once or twice a year. Routine eye exams. Ask your health care provider how often you should have your eyes checked. Personal lifestyle choices, including: Daily care of your teeth and gums. Regular physical activity. Eating a healthy diet. Avoiding tobacco and drug use. Limiting alcohol use. Practicing safe sex. Taking low-dose aspirin every day. Taking vitamin and mineral supplements as recommended by your health care provider. What happens during an annual well check? The services and screenings done by your health care provider during your annual well check will depend on your age, overall health,  lifestyle risk factors, and family history of disease. Counseling  Your health care provider may ask you questions about your: Alcohol use. Tobacco use. Drug use. Emotional well-being. Home and relationship well-being. Sexual activity. Eating habits. History of falls. Memory and ability to understand (cognition). Work and work Statistician. Reproductive health. Screening  You may have the following tests or measurements: Height, weight, and BMI. Blood pressure. Lipid and cholesterol levels. These may be checked every 5 years, or more frequently if you are over 32 years old. Skin check. Lung cancer screening. You may have this screening every year starting at age 72 if you have a 30-pack-year history of smoking and currently smoke or have quit within the past 15 years. Fecal occult blood test (FOBT) of the stool. You may have this test every year starting at age 7. Flexible sigmoidoscopy or colonoscopy. You may have a sigmoidoscopy every 5 years or a colonoscopy every 10 years starting at age 21. Hepatitis C blood test. Hepatitis B blood test. Sexually transmitted disease (STD) testing. Diabetes screening. This is done by checking your blood sugar (glucose) after you have not eaten for a while (fasting). You may have this done every 1-3 years. Bone density scan. This is done to screen for osteoporosis. You may have this done starting at age 89. Mammogram. This may be done every 1-2 years. Talk to your health care provider about how often you should have regular mammograms. Talk with your health care provider about your test results, treatment options, and if necessary, the need for more tests. Vaccines  Your health care provider may recommend certain vaccines, such as: Influenza vaccine. This is recommended every year. Tetanus, diphtheria, and acellular pertussis (Tdap, Td) vaccine. You may need a Td booster every 10 years. Zoster vaccine.  You may need this after age  5. Pneumococcal 13-valent conjugate (PCV13) vaccine. One dose is recommended after age 38. Pneumococcal polysaccharide (PPSV23) vaccine. One dose is recommended after age 93. Talk to your health care provider about which screenings and vaccines you need and how often you need them. This information is not intended to replace advice given to you by your health care provider. Make sure you discuss any questions you have with your health care provider. Document Released: 12/07/2015 Document Revised: 07/30/2016 Document Reviewed: 09/11/2015 Elsevier Interactive Patient Education  2017 [employer] Prevention in the Home Falls can cause injuries. They can happen to people of all ages. There are many things you can do to make your home safe and to help prevent falls. What can I do on the outside of my home? Regularly fix the edges of walkways and driveways and fix any cracks. Remove anything that might make you trip as you walk through a door, such as a raised step or threshold. Trim any bushes or trees on the path to your home. Use bright outdoor lighting. Clear any walking paths of anything that might make someone trip, such as rocks or tools. Regularly check to see if handrails are loose or broken. Make sure that both sides of any steps have handrails. Any raised decks and porches should have guardrails on the edges. Have any leaves, snow, or ice cleared regularly. Use sand or salt on walking paths during winter. Clean up any spills in your garage right away. This includes oil or grease spills. What can I do in the bathroom? Use night lights. Install grab bars by the toilet and in the tub and shower. Do not use towel bars as grab bars. Use non-skid mats or decals in the tub or shower. If you need to sit down in the shower, use a plastic, non-slip stool. Keep the floor dry. Clean up any water that spills on the floor as soon as it happens. Remove soap buildup in the tub or shower  regularly. Attach bath mats securely with double-sided non-slip rug tape. Do not have throw rugs and other things on the floor that can make you trip. What can I do in the bedroom? Use night lights. Make sure that you have a light by your bed that is easy to reach. Do not use any sheets or blankets that are too big for your bed. They should not hang down onto the floor. Have a firm chair that has side arms. You can use this for support while you get dressed. Do not have throw rugs and other things on the floor that can make you trip. What can I do in the kitchen? Clean up any spills right away. Avoid walking on wet floors. Keep items that you use a lot in easy-to-reach places. If you need to reach something above you, use a strong step stool that has a grab bar. Keep electrical cords out of the way. Do not use floor polish or wax that makes floors slippery. If you must use wax, use non-skid floor wax. Do not have throw rugs and other things on the floor that can make you trip. What can I do with my stairs? Do not leave any items on the stairs. Make sure that there are handrails on both sides of the stairs and use them. Fix handrails that are broken or loose. Make sure that handrails are as long as the stairways. Check any carpeting to make sure that it is  firmly attached to the stairs. Fix any carpet that is loose or worn. Avoid having throw rugs at the top or bottom of the stairs. If you do have throw rugs, attach them to the floor with carpet tape. Make sure that you have a light switch at the top of the stairs and the bottom of the stairs. If you do not have them, ask someone to add them for you. What else can I do to help prevent falls? Wear shoes that: Do not have high heels. Have rubber bottoms. Are comfortable and fit you well. Are closed at the toe. Do not wear sandals. If you use a stepladder: Make sure that it is fully opened. Do not climb a closed stepladder. Make sure that  both sides of the stepladder are locked into place. Ask someone to hold it for you, if possible. Clearly mark and make sure that you can see: Any grab bars or handrails. First and last steps. Where the edge of each step is. Use tools that help you move around (mobility aids) if they are needed. These include: Canes. Walkers. Scooters. Crutches. Turn on the lights when you go into a dark area. Replace any light bulbs as soon as they burn out. Set up your furniture so you have a clear path. Avoid moving your furniture around. If any of your floors are uneven, fix them. If there are any pets around you, be aware of where they are. Review your medicines with your doctor. Some medicines can make you feel dizzy. This can increase your chance of falling. Ask your doctor what other things that you can do to help prevent falls. This information is not intended to replace advice given to you by your health care provider. Make sure you discuss any questions you have with your health care provider. Document Released: 09/06/2009 Document Revised: 04/17/2016 Document Reviewed: 12/15/2014 Elsevier Interactive Patient Education  2017 [employer].

## 2022-07-01 NOTE — Progress Notes (Signed)
I connected with Kelli Nielsen today by telephone and verified that I am speaking with the correct person using two identifiers. Location patient: home Location provider: work Persons participating in the virtual visit: patient, provider.   I discussed the limitations, risks, security and privacy concerns of performing an evaluation and management service by telephone and the availability of in person appointments. I also discussed with the patient that there may be a patient responsible charge related to this service. The patient expressed understanding and verbally consented to this telephonic visit.    Interactive audio and video telecommunications were attempted between this provider and patient, however failed, due to patient having technical difficulties OR patient did not have access to video capability.  We continued and completed visit with audio only.  Some vital signs may be absent or patient reported.   Time Spent with patient on telephone encounter: 30 minutes  Subjective:   Kelli Nielsen is a 86 y.o. female who presents for Medicare Annual (Subsequent) preventive examination.  Review of Systems     Cardiac Risk Factors include: advanced age (>76mn, >>60women);family history of premature cardiovascular disease     Objective:    There were no vitals filed for this visit. There is no height or weight on file to calculate BMI.     07/01/2022    4:11 PM 06/27/2021    3:46 PM  Advanced Directives  Does Patient Have a Medical Advance Directive? Yes Yes  Type of Advance Directive Living will;Healthcare Power of Attorney Living will;Healthcare Power of Attorney  Does patient want to make changes to medical advance directive? No - Patient declined No - Patient declined  Copy of HVictoria Verain Chart? No - copy requested No - copy requested  Would patient like information on creating a medical advance directive? No - Patient declined     Current Medications  (verified) Outpatient Encounter Medications as of 07/01/2022  Medication Sig   Probiotic Product (ALIGN) 4 MG CAPS Take 1 capsule by mouth daily.   [DISCONTINUED] ALPRAZolam (XANAX) 0.25 MG tablet Take 1 tablet (0.25 mg total) by mouth at bedtime as needed. for sleep (Patient not taking: Reported on 07/01/2022)   [DISCONTINUED] FLUoxetine (PROZAC) 10 MG tablet Take 1 tablet (10 mg total) by mouth daily. (Patient not taking: Reported on 07/01/2022)   No facility-administered encounter medications on file as of 07/01/2022.    Allergies (verified) Azithromycin, Penicillins, and Celexa [citalopram hydrobromide]   History: Past Medical History:  Diagnosis Date   Adenomatous colon polyp    Anxiety    Past Surgical History:  Procedure Laterality Date   APPENDECTOMY  1959   peritonitis   BREAST BIOPSY     benign   COLONOSCOPY  2014   negative; Dr KDeatra Ina  TONSILLECTOMY  1959   Family History  Problem Relation Age of Onset   Heart disease Mother        ? MI   Diabetes Neg Hx    Cancer Neg Hx    Stroke Neg Hx    Social History   Socioeconomic History   Marital status: Married    Spouse name: Not on file   Number of children: 1   Years of education: 13   Highest education level: Not on file  Occupational History   Not on file  Tobacco Use   Smoking status: Never   Smokeless tobacco: Never  Vaping Use   Vaping Use: Never used  Substance and Sexual Activity  Alcohol use: Yes    Alcohol/week: 2.0 standard drinks of alcohol    Types: 2 Glasses of wine per week   Drug use: No   Sexual activity: Not on file  Other Topics Concern   Not on file  Social History Narrative   Fun: Not much fun    Denies abuse and feels safe at home.    Social Determinants of Health   Financial Resource Strain: Low Risk  (07/01/2022)   Overall Financial Resource Strain (CARDIA)    Difficulty of Paying Living Expenses: Not hard at all  Food Insecurity: No Food Insecurity (07/01/2022)   Hunger Vital  Sign    Worried About Running Out of Food in the Last Year: Never true    Ran Out of Food in the Last Year: Never true  Transportation Needs: No Transportation Needs (07/01/2022)   PRAPARE - Hydrologist (Medical): No    Lack of Transportation (Non-Medical): No  Physical Activity: Sufficiently Active (07/01/2022)   Exercise Vital Sign    Days of Exercise per Week: 5 days    Minutes of Exercise per Session: 60 min  Stress: No Stress Concern Present (07/01/2022)   [place]    Feeling of Stress : Not at all  Social Connections: Moderately Integrated (07/01/2022)   Social Connection and Isolation Panel [NHANES]    Frequency of Communication with Friends and Family: More than three times a week    Frequency of Social Gatherings with Friends and Family: More than three times a week    Attends Religious Services: 1 to 4 times per year    Active Member of [employer] or Organizations: No    Attends Archivist Meetings: 1 to 4 times per year    Marital Status: Widowed    Tobacco Counseling Counseling given: Not Answered   Clinical Intake:  Pre-visit preparation completed: Yes  Pain : No/denies pain     Nutritional Risks: None Diabetes: No  How often do you need to have someone help you when you read instructions, pamphlets, or other written materials from your doctor or pharmacy?: 1 - Never What is the last grade level you completed in school?: HSG; some college courses  Diabetic? no  Interpreter Needed?: No  Information entered by :: Lisette Abu, LPN.   Activities of Daily Living    07/01/2022    4:22 PM  In your present state of health, do you have any difficulty performing the following activities:  Hearing? 0  Vision? 0  Difficulty concentrating or making decisions? 0  Walking or climbing stairs? 0  Dressing or bathing? 0  Doing errands, shopping? 0  Preparing Food and  eating ? N  Using the Toilet? N  In the past six months, have you accidently leaked urine? N  Do you have problems with loss of bowel control? N  Managing your Medications? N  Managing your Finances? N  Housekeeping or managing your Housekeeping? N    Patient Care Team: Biagio Borg, MD as PCP - General (Internal Medicine) Rutherford Guys, MD as Consulting Physician (Ophthalmology)  Indicate any recent Medical Services you may have received from other than [facility] providers in the past year (date may be approximate).     Assessment:   This is a routine wellness examination for Jiali.  Hearing/Vision screen Hearing Screening - Comments:: No issues with hearing. No hearing aids. Vision Screening - Comments:: Patient wears contacts. Eye  exam done by: Rutherford Guys, MD.  Dietary issues and exercise activities discussed: Current Exercise Habits: Home exercise routine, Type of exercise: walking;Other - see comments (works 40-48 hours per week), Time (Minutes): 60, Frequency (Times/Week): 5, Weekly Exercise (Minutes/Week): 300, Intensity: Moderate, Exercise limited by: None identified   Goals Addressed             This Visit's Progress    My goal is to stay independent and maintain my health.        Depression Screen    07/01/2022    4:22 PM 01/29/2022   11:05 AM 10/24/2021    8:57 AM 10/24/2021    8:17 AM 06/27/2021    3:57 PM 09/12/2020    9:48 AM 09/12/2020    8:53 AM  PHQ 2/9 Scores  PHQ - 2 Score 0 0 1 0 1 1 0  PHQ- 9 Score  0     0    Fall Risk    07/01/2022    4:21 PM 10/24/2021    8:55 AM 10/24/2021    8:17 AM 06/27/2021    3:47 PM 09/12/2020    9:48 AM  Fall Risk   Falls in the past year? 0 0 0 0 0  Number falls in past yr: 0 0 0 0   Injury with Fall? 0 0 0 0   Risk for fall due to : No Fall Risks   No Fall Risks   Follow up Falls evaluation completed   Falls evaluation completed     FALL RISK PREVENTION PERTAINING TO THE HOME:  Any stairs in or around the home?  No  If so, are there any without handrails? No  Home free of loose throw rugs in walkways, pet beds, electrical cords, etc? Yes  Adequate lighting in your home to reduce risk of falls? Yes   ASSISTIVE DEVICES UTILIZED TO PREVENT FALLS:  Life alert? No  Use of a cane, walker or w/c? No  Grab bars in the bathroom? No  Shower chair or bench in shower? No  Elevated toilet seat or a handicapped toilet? No   TIMED UP AND GO:  Was the test performed? No .  Length of time to ambulate 10 feet: n/a sec.   Appearance of gait: Gait not evaluated during this visit.  Cognitive Function:        07/01/2022    4:23 PM  6CIT Screen  What Year? 0 points  What month? 0 points  What time? 0 points  Count back from 20 0 points  Months in reverse 0 points  Repeat phrase 0 points  Total Score 0 points    Immunizations Immunization History  Administered Date(s) Administered   Fluad Quad(high Dose 65+) 07/30/2019, 09/12/2020, 08/31/2021   Influenza Split 09/02/2011, 09/08/2012   Influenza Whole 10/06/2007, 08/16/2008, 08/21/2009, 09/05/2010   Influenza, High Dose Seasonal PF 08/17/2015, 08/12/2016, 08/18/2017, 08/11/2018   Influenza,inj,Quad PF,6+ Mos 08/15/2014   PFIZER(Purple Top)SARS-COV-2 Vaccination 01/08/2020, 01/31/2020, 11/21/2020   Pneumococcal Conjugate-13 03/27/2016   Pneumococcal Polysaccharide-23 09/04/2008   Tdap 11/21/2011    TDAP status: Due, Education has been provided regarding the importance of this vaccine. Advised may receive this vaccine at local pharmacy or Health Dept. Aware to provide a copy of the vaccination record if obtained from local pharmacy or Health Dept. Verbalized acceptance and understanding.  Flu Vaccine status: Up to date  Pneumococcal vaccine status: Up to date  Covid-19 vaccine status: Completed vaccines  Qualifies for Shingles Vaccine? Yes  Zostavax completed No   Shingrix Completed?: No.    Education has been provided regarding the  importance of this vaccine. Patient has been advised to call insurance company to determine out of pocket expense if they have not yet received this vaccine. Advised may also receive vaccine at local pharmacy or Health Dept. Verbalized acceptance and understanding.  Screening Tests Health Maintenance  Topic Date Due   Zoster Vaccines- Shingrix (1 of 2) Never done   COVID-19 Vaccine (4 - Pfizer series) 01/16/2021   TETANUS/TDAP  11/20/2021   INFLUENZA VACCINE  06/24/2022   Pneumonia Vaccine 46+ Years old  Completed   DEXA SCAN  Completed   HPV VACCINES  Aged Out    Health Maintenance  Health Maintenance Due  Topic Date Due   Zoster Vaccines- Shingrix (1 of 2) Never done   COVID-19 Vaccine (4 - Pfizer series) 01/16/2021   TETANUS/TDAP  11/20/2021   INFLUENZA VACCINE  06/24/2022    Colorectal cancer screening: No longer required.   Mammogram status: No longer required due to age.  Bone Density status: No longer required due to age.  Lung Cancer Screening: (Low Dose CT Chest recommended if Age 62-80 years, 30 pack-year currently smoking OR have quit w/in 15years.) does not qualify.   Lung Cancer Screening Referral: no  Additional Screening:  Hepatitis C Screening: does not qualify; Completed no  Vision Screening: Recommended annual ophthalmology exams for early detection of glaucoma and other disorders of the eye. Is the patient up to date with their annual eye exam?  Yes  Who is the provider or what is the name of the office in which the patient attends annual eye exams? Rutherford Guys, MD. If pt is not established with a provider, would they like to be referred to a provider to establish care? No .   Dental Screening: Recommended annual dental exams for proper oral hygiene  Community Resource Referral / Chronic Care Management: CRR required this visit?  No   CCM required this visit?  No      Plan:     I have personally reviewed and noted the following in the  patient's chart:   Medical and social history Use of alcohol, tobacco or illicit drugs  Current medications and supplements including opioid prescriptions.  Functional ability and status Nutritional status Physical activity Advanced directives List of other physicians Hospitalizations, surgeries, and ER visits in previous 12 months Vitals Screenings to include cognitive, depression, and falls Referrals and appointments  In addition, I have reviewed and discussed with patient certain preventive protocols, quality metrics, and best practice recommendations. A written personalized care plan for preventive services as well as general preventive health recommendations were provided to patient.     Sheral Flow, LPN   12/01/5629   Nurse Notes:  No vital signs completed during this visit. No height or weight to calculate BMI. Medications reviewed with patient, no opioid use.

## 2022-08-07 ENCOUNTER — Ambulatory Visit: Payer: Medicare Other | Admitting: Family Medicine

## 2022-08-07 ENCOUNTER — Encounter: Payer: Self-pay | Admitting: Family Medicine

## 2022-08-07 VITALS — BP 148/84 | HR 84 | Temp 97.8°F | Ht 68.0 in | Wt 129.0 lb

## 2022-08-07 DIAGNOSIS — R140 Abdominal distension (gaseous): Secondary | ICD-10-CM | POA: Diagnosis not present

## 2022-08-07 DIAGNOSIS — R1084 Generalized abdominal pain: Secondary | ICD-10-CM

## 2022-08-07 DIAGNOSIS — R1111 Vomiting without nausea: Secondary | ICD-10-CM | POA: Diagnosis not present

## 2022-08-07 DIAGNOSIS — R197 Diarrhea, unspecified: Secondary | ICD-10-CM

## 2022-08-07 LAB — COMPREHENSIVE METABOLIC PANEL
ALT: 17 U/L (ref 0–35)
AST: 32 U/L (ref 0–37)
Albumin: 4.3 g/dL (ref 3.5–5.2)
Alkaline Phosphatase: 52 U/L (ref 39–117)
BUN: 11 mg/dL (ref 6–23)
CO2: 28 mEq/L (ref 19–32)
Calcium: 9.5 mg/dL (ref 8.4–10.5)
Chloride: 103 mEq/L (ref 96–112)
Creatinine, Ser: 0.67 mg/dL (ref 0.40–1.20)
GFR: 80.3 mL/min (ref >60.00)
Glucose, Bld: 98 mg/dL (ref 70–99)
Potassium: 3.6 mEq/L (ref 3.5–5.1)
Sodium: 141 mEq/L (ref 135–145)
Total Bilirubin: 0.9 mg/dL (ref 0.2–1.2)
Total Protein: 7.5 g/dL (ref 6.0–8.3)

## 2022-08-07 LAB — LIPASE: Lipase: 20 U/L (ref 11.0–59.0)

## 2022-08-07 NOTE — Progress Notes (Unsigned)
Subjective:     Patient ID: Kelli Nielsen, female    DOB: Jan 05, 1936, 86 y.o.   MRN: [id number]  Chief Complaint  Patient presents with   GI Problem    Last Friday she had bad stomach pains, bloating and diarrhea. Has been taking Gas-x and pepto bismol with some relief. States every time she eats it comes back.     HPI Patient is in today for postprandial abdominal pain, bloating, diarrhea and decreased appetite. She had vomiting and diarrhea after eating pizza. She has intermittent nausea.  No blood or pus in stool.   Taking Align, Gas X, Pepto or Imodium without any relief.   Denies fever, chills, dizziness, chest pain, palpitations, shortness of breath, back pain, urinary symptoms.      There are no preventive care reminders to display for this patient.   Past Medical History:  Diagnosis Date   Adenomatous colon polyp    Anxiety     Past Surgical History:  Procedure Laterality Date   APPENDECTOMY  1959   peritonitis   BREAST BIOPSY     benign   COLONOSCOPY  2014   negative; Dr Deatra Ina   TONSILLECTOMY  1959    Family History  Problem Relation Age of Onset   Heart disease Mother        ? MI   Diabetes Neg Hx    Cancer Neg Hx    Stroke Neg Hx     Social History   Socioeconomic History   Marital status: Married    Spouse name: Not on file   Number of children: 1   Years of education: 13   Highest education level: Not on file  Occupational History   Not on file  Tobacco Use   Smoking status: Never   Smokeless tobacco: Never  Vaping Use   Vaping Use: Never used  Substance and Sexual Activity   Alcohol use: Yes    Alcohol/week: 2.0 standard drinks of alcohol    Types: 2 Glasses of wine per week   Drug use: No   Sexual activity: Not on file  Other Topics Concern   Not on file  Social History Narrative   Fun: Not much fun    Denies abuse and feels safe at home.    Social Determinants of Health   Financial Resource Strain: Low Risk  (07/01/2022)    Overall Financial Resource Strain (CARDIA)    Difficulty of Paying Living Expenses: Not hard at all  Food Insecurity: No Food Insecurity (07/01/2022)   Hunger Vital Sign    Worried About Running Out of Food in the Last Year: Never true    Ran Out of Food in the Last Year: Never true  Transportation Needs: No Transportation Needs (07/01/2022)   PRAPARE - Hydrologist (Medical): No    Lack of Transportation (Non-Medical): No  Physical Activity: Sufficiently Active (07/01/2022)   Exercise Vital Sign    Days of Exercise per Week: 5 days    Minutes of Exercise per Session: 60 min  Stress: No Stress Concern Present (07/01/2022)   [place]    Feeling of Stress : Not at all  Social Connections: Moderately Integrated (07/01/2022)   Social Connection and Isolation Panel [NHANES]    Frequency of Communication with Friends and Family: More than three times a week    Frequency of Social Gatherings with Friends and Family: More than three times a  week    Attends Religious Services: 1 to 4 times per year    Active Member of Clubs or Organizations: No    Attends Archivist Meetings: 1 to 4 times per year    Marital Status: Widowed  Intimate Partner Violence: Not At Risk (07/01/2022)   Humiliation, Afraid, Rape, and Kick questionnaire    Fear of Current or Ex-Partner: No    Emotionally Abused: No    Physically Abused: No    Sexually Abused: No    Outpatient Medications Prior to Visit  Medication Sig Dispense Refill   Probiotic Product (ALIGN) 4 MG CAPS Take 1 capsule by mouth daily.     No facility-administered medications prior to visit.    Allergies  Allergen Reactions   Azithromycin     REACTION: elevated liver enzymes   Penicillins Hives    Hives    Celexa [Citalopram Hydrobromide]     ROS     Objective:    Physical Exam Constitutional:      General: She is not in acute  distress.    Appearance: She is ill-appearing.  HENT:     Mouth/Throat:     Mouth: Mucous membranes are moist.  Eyes:     Conjunctiva/sclera: Conjunctivae normal.     Pupils: Pupils are equal, round, and reactive to light.  Cardiovascular:     Rate and Rhythm: Normal rate.  Pulmonary:     Effort: Pulmonary effort is normal.     Breath sounds: Normal breath sounds.  Abdominal:     General: Bowel sounds are increased. There is no distension.     Palpations: Abdomen is soft.     Tenderness: There is generalized abdominal tenderness. There is no right CVA tenderness, left CVA tenderness, guarding or rebound. Negative signs include Murphy's sign and Rovsing's sign.  Musculoskeletal:        General: Normal range of motion.     Cervical back: Normal range of motion and neck supple.  Lymphadenopathy:     Cervical: No cervical adenopathy.  Skin:    General: Skin is warm and dry.     Capillary Refill: Capillary refill takes less than 2 seconds.  Neurological:     General: No focal deficit present.     Mental Status: She is alert and oriented to person, place, and time.     Cranial Nerves: No cranial nerve deficit.     Sensory: No sensory deficit.     Motor: No weakness.     Coordination: Coordination normal.     Gait: Gait normal.  Psychiatric:        Mood and Affect: Mood normal.        Behavior: Behavior normal.     BP (!) 148/84 (BP Location: Left Arm, Patient Position: Sitting, Cuff Size: Normal)   Pulse 84   Temp 97.8 F (36.6 C) (Temporal)   Ht '5\' 8"'$  (1.727 m)   Wt 129 lb (58.5 kg)   SpO2 99%   BMI 19.61 kg/m  Wt Readings from Last 3 Encounters:  08/07/22 129 lb (58.5 kg)  03/17/22 130 lb (59 kg)  01/29/22 131 lb (59.4 kg)       Assessment & Plan:   Problem List Items Addressed This Visit       Other   Abdominal pain   Relevant Orders   CBC with Differential/Platelet   Comprehensive metabolic panel (Completed)   Lipase (Completed)   CT ABDOMEN PELVIS W WO  CONTRAST   Diarrhea  Relevant Orders   CBC with Differential/Platelet   Comprehensive metabolic panel (Completed)   CT ABDOMEN PELVIS W WO CONTRAST   Other Visit Diagnoses     Postprandial bloating    -  Primary   Relevant Orders   CBC with Differential/Platelet   Comprehensive metabolic panel (Completed)   Lipase (Completed)   CT ABDOMEN PELVIS W WO CONTRAST   Vomiting without nausea, unspecified vomiting type       Relevant Orders   CBC with Differential/Platelet   Comprehensive metabolic panel (Completed)   Lipase (Completed)   CT ABDOMEN PELVIS W WO CONTRAST      CBC, CMP, Lipase ordered as well as CT abdomen pelvis. She is visibly ill but non toxic. Continue with symptomatic treatment and bland diet. Follow up pending results.    I am having Jaleiyah B. Lofgren maintain her Electronics engineer.  No orders of the defined types were placed in this encounter.

## 2022-08-07 NOTE — Patient Instructions (Signed)
Please go downstairs for labs before you leave.   You will hear from [facility] to schedule a Cat scan of your abdomen.

## 2022-08-08 ENCOUNTER — Telehealth: Payer: Self-pay

## 2022-08-08 NOTE — Telephone Encounter (Signed)
Called [facility] to seek clarification on missing lab result from visit on 08/07/22. She reports that it looks like the CBC lab was never drawn but will reach out to the staff that was working yesterday for further clarification.

## 2022-08-12 ENCOUNTER — Ambulatory Visit
Admission: RE | Admit: 2022-08-12 | Discharge: 2022-08-12 | Disposition: A | Discharge status: Home / Self Care | Payer: Medicare Other | Source: Ambulatory Visit | Attending: Family Medicine | Admitting: Family Medicine

## 2022-08-12 DIAGNOSIS — R1084 Generalized abdominal pain: Secondary | ICD-10-CM

## 2022-08-12 DIAGNOSIS — R1111 Vomiting without nausea: Secondary | ICD-10-CM

## 2022-08-12 DIAGNOSIS — R197 Diarrhea, unspecified: Secondary | ICD-10-CM

## 2022-08-12 DIAGNOSIS — K5730 Diverticulosis of large intestine without perforation or abscess without bleeding: Secondary | ICD-10-CM | POA: Diagnosis not present

## 2022-08-12 DIAGNOSIS — K3189 Other diseases of stomach and duodenum: Secondary | ICD-10-CM | POA: Diagnosis not present

## 2022-08-12 DIAGNOSIS — I2510 Atherosclerotic heart disease of native coronary artery without angina pectoris: Secondary | ICD-10-CM | POA: Diagnosis not present

## 2022-08-12 DIAGNOSIS — R140 Abdominal distension (gaseous): Secondary | ICD-10-CM

## 2022-08-12 DIAGNOSIS — K449 Diaphragmatic hernia without obstruction or gangrene: Secondary | ICD-10-CM | POA: Diagnosis not present

## 2022-08-12 MED ORDER — IOPAMIDOL (ISOVUE-300) INJECTION 61%
100.0000 mL | Freq: Once | INTRAVENOUS | Status: AC | PRN
  Administered 2022-08-12: 100 mL via INTRAVENOUS

## 2022-08-13 NOTE — Progress Notes (Signed)
Please schedule ov for follow up to see how she is doing and to discuss CT results. Nothing worrisome on the CT but we need to follow up regarding the results and symptoms.

## 2022-08-14 ENCOUNTER — Ambulatory Visit (INDEPENDENT_AMBULATORY_CARE_PROVIDER_SITE_OTHER): Payer: Medicare Other | Admitting: Family Medicine

## 2022-08-14 ENCOUNTER — Ambulatory Visit (INDEPENDENT_AMBULATORY_CARE_PROVIDER_SITE_OTHER): Payer: Medicare Other

## 2022-08-14 ENCOUNTER — Encounter: Payer: Self-pay | Admitting: Family Medicine

## 2022-08-14 VITALS — BP 128/74 | HR 90 | Temp 97.6°F | Ht 68.0 in | Wt 130.0 lb

## 2022-08-14 DIAGNOSIS — R140 Abdominal distension (gaseous): Secondary | ICD-10-CM

## 2022-08-14 DIAGNOSIS — R053 Chronic cough: Secondary | ICD-10-CM

## 2022-08-14 DIAGNOSIS — E559 Vitamin D deficiency, unspecified: Secondary | ICD-10-CM

## 2022-08-14 DIAGNOSIS — R5383 Other fatigue: Secondary | ICD-10-CM | POA: Diagnosis not present

## 2022-08-14 DIAGNOSIS — R911 Solitary pulmonary nodule: Secondary | ICD-10-CM | POA: Diagnosis not present

## 2022-08-14 DIAGNOSIS — J9811 Atelectasis: Secondary | ICD-10-CM | POA: Diagnosis not present

## 2022-08-14 MED ORDER — DOXYCYCLINE HYCLATE 100 MG PO TABS: 100.0000 mg | ORAL_TABLET | Freq: Two times a day (BID) | ORAL | 0 refills | Status: DC

## 2022-08-14 MED ORDER — SULFAMETHOXAZOLE-TRIMETHOPRIM 800-160 MG PO TABS: 1.0000 | ORAL_TABLET | Freq: Two times a day (BID) | ORAL | 0 refills | Status: DC

## 2022-08-14 NOTE — Assessment & Plan Note (Signed)
Discussed multiple etiologies. This is not new. Check labs today. Follow up with PCP.

## 2022-08-14 NOTE — Progress Notes (Signed)
Subjective:     Patient ID: Kelli Nielsen, female    DOB: August 15, 1936, 87 y.o.   MRN: [id number]  Chief Complaint  Patient presents with   Results    Discuss CT     HPI Patient is in today with her daughter for follow up of diarrhea and bloating.  CT abdomen showed possible gastritis.  Today her biggest concern is fatigue and a chronic postnasal drip with cough.  States she is often hoarse in the mornings but it improves throughout the day.  She is taking a probiotic.  Reports eating more and not having as severe stomach pain.  There are no preventive care reminders to display for this patient.  Past Medical History:  Diagnosis Date   Adenomatous colon polyp    Anxiety     Past Surgical History:  Procedure Laterality Date   APPENDECTOMY  1959   peritonitis   BREAST BIOPSY     benign   COLONOSCOPY  2014   negative; Dr Deatra Ina   TONSILLECTOMY  1959    Family History  Problem Relation Age of Onset   Heart disease Mother        ? MI   Diabetes Neg Hx    Cancer Neg Hx    Stroke Neg Hx     Social History   Socioeconomic History   Marital status: Married    Spouse name: Not on file   Number of children: 1   Years of education: 13   Highest education level: Not on file  Occupational History   Not on file  Tobacco Use   Smoking status: Never   Smokeless tobacco: Never  Vaping Use   Vaping Use: Never used  Substance and Sexual Activity   Alcohol use: Yes    Alcohol/week: 2.0 standard drinks of alcohol    Types: 2 Glasses of wine per week   Drug use: No   Sexual activity: Not on file  Other Topics Concern   Not on file  Social History Narrative   Fun: Not much fun    Denies abuse and feels safe at home.    Social Determinants of Health   Financial Resource Strain: Low Risk  (07/01/2022)   Overall Financial Resource Strain (CARDIA)    Difficulty of Paying Living Expenses: Not hard at all  Food Insecurity: No Food Insecurity (07/01/2022)   Hunger Vital  Sign    Worried About Running Out of Food in the Last Year: Never true    Ran Out of Food in the Last Year: Never true  Transportation Needs: No Transportation Needs (07/01/2022)   PRAPARE - Hydrologist (Medical): No    Lack of Transportation (Non-Medical): No  Physical Activity: Sufficiently Active (07/01/2022)   Exercise Vital Sign    Days of Exercise per Week: 5 days    Minutes of Exercise per Session: 60 min  Stress: No Stress Concern Present (07/01/2022)   [place]    Feeling of Stress : Not at all  Social Connections: Moderately Integrated (07/01/2022)   Social Connection and Isolation Panel [NHANES]    Frequency of Communication with Friends and Family: More than three times a week    Frequency of Social Gatherings with Friends and Family: More than three times a week    Attends Religious Services: 1 to 4 times per year    Active Member of Clubs or Organizations: No  Attends Archivist Meetings: 1 to 4 times per year    Marital Status: Widowed  Intimate Partner Violence: Not At Risk (07/01/2022)   Humiliation, Afraid, Rape, and Kick questionnaire    Fear of Current or Ex-Partner: No    Emotionally Abused: No    Physically Abused: No    Sexually Abused: No    Outpatient Medications Prior to Visit  Medication Sig Dispense Refill   Probiotic Product (ALIGN) 4 MG CAPS Take 1 capsule by mouth daily. (Patient not taking: Reported on 08/14/2022)     No facility-administered medications prior to visit.    Allergies  Allergen Reactions   Azithromycin     REACTION: elevated liver enzymes   Penicillins Hives    Hives    Celexa [Citalopram Hydrobromide]     ROS     Objective:    Physical Exam Constitutional:      General: She is not in acute distress.    Appearance: She is not ill-appearing.  Cardiovascular:     Rate and Rhythm: Normal rate and regular rhythm.   Pulmonary:     Effort: Pulmonary effort is normal.     Breath sounds: Normal breath sounds.  Musculoskeletal:     Right lower leg: No edema.     Left lower leg: No edema.  Skin:    General: Skin is warm and dry.  Neurological:     General: No focal deficit present.     Mental Status: She is alert and oriented to person, place, and time.  Psychiatric:        Mood and Affect: Mood normal.        Behavior: Behavior normal.     BP 128/74 (BP Location: Left Arm, Patient Position: Sitting, Cuff Size: Large)   Pulse 90   Temp 97.6 F (36.4 C) (Temporal)   Ht '5\' 8"'$  (1.727 m)   Wt 130 lb (59 kg)   SpO2 98%   BMI 19.77 kg/m  Wt Readings from Last 3 Encounters:  08/14/22 130 lb (59 kg)  08/07/22 129 lb (58.5 kg)  03/17/22 130 lb (59 kg)   Narrative & Impression  CLINICAL DATA:  Abdominal pain, acute, nonlocalized   EXAM: CT ABDOMEN AND PELVIS WITH CONTRAST   TECHNIQUE: Multidetector CT imaging of the abdomen and pelvis was performed using the standard protocol following bolus administration of intravenous contrast.   RADIATION DOSE REDUCTION: This exam was performed according to the departmental dose-optimization program which includes automated exposure control, adjustment of the mA and/or kV according to patient size and/or use of iterative reconstruction technique.   CONTRAST:  184m ISOVUE-300 IOPAMIDOL (ISOVUE-300) INJECTION 61%   COMPARISON:  CT abdomen pelvis 09/11/2006   FINDINGS: Lower chest: Right middle lobe and lingular atelectasis. There is a solid 6 mm 6 mm right lower lobe pulmonary nodule (series 5, image 17). Additional tree-in-bud nodularity in the periphery of the right lower lobe.   Hepatobiliary: No focal liver abnormality is seen. The gallbladder is unremarkable.   Pancreas: Unremarkable. No pancreatic ductal dilatation or surrounding inflammatory changes.   Spleen: Normal in size without focal abnormality.   Adrenals/Urinary Tract:  Adrenal glands are unremarkable. There is mild prominence of the renal collecting systems bilaterally likely related to a distended bladder. There is no obstructing lesion touches the stone identified. There is a simple right upper pole renal cyst which requires no follow-up imaging. No nephroureterolithiasis.   Stomach/Bowel: Small hiatal hernia. Mild wall thickening of the distal stomach. There  is no evidence of bowel obstruction.The appendix is normal. There is hyperdense material in the ascending, transverse, and splenic flexure of the colon. There is sigmoid diverticulosis without evidence of acute diverticulitis. No acute inflammatory process involving the bowel. Mild-to-moderate colonic stool burden.   Vascular/Lymphatic: Scattered atherosclerosis. No AAA. No lymphadenopathy. Focal right proximal femoral venous ectasia.   Reproductive: Unremarkable.   Other: No abdominal wall hernia or abnormality. No abdominopelvic ascites.   Musculoskeletal: No acute osseous abnormality. No suspicious osseous lesion. Multilevel degenerative changes of the spine.   IMPRESSION: Mild wall thickening of the distal stomach, may suggest gastritis.   Prominence of the renal collecting systems bilaterally likely due to a distended bladder.   Colonic diverticulosis.  No acute diverticulitis.   Normal appendix.  Small hiatal hernia.   Tree-in-bud nodularity in the periphery of the right lower lobe and a solid 6 mm right lower lobe pulmonary nodule. Findings could be infectious/inflammatory. Non-contrast chest CT at 6-12 months is recommended. If the nodule is stable at time of repeat CT, then future CT at 18-24 months (from today's scan) is considered optional for low-risk patients, but is recommended for high-risk patients. This recommendation follows the consensus statement: Guidelines for Management of Incidental Pulmonary Nodules Detected on CT Images: From the Fleischner Society 2017;  Radiology 2017; 284:228-243.     Electronically Signed   By: Maurine Simmering M.D.   On: 08/12/2022 10:53           Assessment & Plan:   Problem List Items Addressed This Visit       Respiratory   Solid nodule of lung 6 mm to 8 mm in diameter    She and her daughter are aware of recommended CT follow up due to incidental finding of lung nodule.       Relevant Orders   DG Chest 2 View (Completed)     Other   Abdominal bloating    Diarrhea has resolved. Continue probiotic daily and Gas X prn.  She is still having bloating although somewhat improved. Reviewed CT results showing possible gastritis.  Offered GI referral if not improving.       Cough    Chronic. Doxycycline prescribed. Chest XR ordered.       Relevant Orders   DG Chest 2 View (Completed)   Fatigue - Primary    Discussed multiple etiologies. This is not new. Check labs today. Follow up with PCP.       Relevant Orders   DG Chest 2 View (Completed)   CBC with Differential/Platelet   TSH   T4, free   VITAMIN D 25 Hydroxy (Vit-D Deficiency, Fractures)   Vitamin B12   Vitamin D deficiency    Check vitamin D and follow up       Relevant Orders   VITAMIN D 25 Hydroxy (Vit-D Deficiency, Fractures)     I have discontinued Srishti B. Orwick's sulfamethoxazole-trimethoprim. I am also having her start on doxycycline. Additionally, I am having her maintain her Align.  Meds ordered this encounter  Medications   DISCONTD: sulfamethoxazole-trimethoprim (BACTRIM DS) 800-160 MG tablet    Sig: Take 1 tablet by mouth 2 (two) times daily.    Dispense:  20 tablet    Refill:  0    Order Specific Question:   Supervising Provider    Answer:   Pricilla Holm A [[id number]]   doxycycline (VIBRA-TABS) 100 MG tablet    Sig: Take 1 tablet (100 mg total) by mouth 2 (two) times  daily.    Dispense:  20 tablet    Refill:  0    Order Specific Question:   Supervising Provider    Answer:   Pricilla Holm A [[id number]]

## 2022-08-14 NOTE — Assessment & Plan Note (Signed)
Check vitamin D and follow up

## 2022-08-14 NOTE — Progress Notes (Signed)
Nothing new on her chest X ray. She will need a CT of her chest in 6 months as discussed.

## 2022-08-14 NOTE — Assessment & Plan Note (Signed)
She and her daughter are aware of recommended CT follow up due to incidental finding of lung nodule.

## 2022-08-14 NOTE — Assessment & Plan Note (Signed)
Diarrhea has resolved. Continue probiotic daily and Gas X prn.  She is still having bloating although somewhat improved. Reviewed CT results showing possible gastritis.  Offered GI referral if not improving.

## 2022-08-14 NOTE — Patient Instructions (Addendum)
Please go downstairs for chest x-ray before you leave.  Return for labs tomorrow or Monday.  Take the Doxycycline as prescribed for the next 10 days.   Follow up with Dr. Jenny Reichmann.   REMEMBER: YOU WILL NEED A CHEST CT IN 6 MONTHS (MARCH 2024) FOR A LUNG NODULE

## 2022-08-14 NOTE — Assessment & Plan Note (Signed)
Chronic. Doxycycline prescribed. Chest XR ordered.

## 2022-09-01 ENCOUNTER — Encounter: Payer: Self-pay | Admitting: Internal Medicine

## 2022-09-01 ENCOUNTER — Ambulatory Visit: Payer: Medicare Other | Admitting: Internal Medicine

## 2022-09-01 VITALS — BP 132/78 | HR 81 | Temp 97.9°F | Ht 68.0 in | Wt 130.0 lb

## 2022-09-01 DIAGNOSIS — R1084 Generalized abdominal pain: Secondary | ICD-10-CM | POA: Diagnosis not present

## 2022-09-01 DIAGNOSIS — Z23 Encounter for immunization: Secondary | ICD-10-CM | POA: Diagnosis not present

## 2022-09-01 DIAGNOSIS — R739 Hyperglycemia, unspecified: Secondary | ICD-10-CM

## 2022-09-01 DIAGNOSIS — E559 Vitamin D deficiency, unspecified: Secondary | ICD-10-CM

## 2022-09-01 MED ORDER — DICYCLOMINE HCL 10 MG PO CAPS: 10.0000 mg | ORAL_CAPSULE | Freq: Four times a day (QID) | ORAL | 2 refills | Status: AC | PRN

## 2022-09-01 NOTE — Assessment & Plan Note (Signed)
Last vitamin D Lab Results  Component Value Date   VD25OH 56.77 10/24/2021   Stable, cont oral replacement

## 2022-09-01 NOTE — Progress Notes (Signed)
Patient ID: Kelli Nielsen, female   DOB: August 15, 1936, 86 y.o.   MRN: [id number]        Chief Complaint: follow up abd pain, abnormal CT       HPI:  Kelli Nielsen is a 86 y.o. female here to f/u after seen per NP recently with abd pain requiring abd CT which fortunately did not show acute problem,  There was tree in bud pulmonary abnormality seen, and pt rx doxycycline course, but she did not take this after became wary of side effect.  Pt denies chest pain, increased sob or doe, wheezing, orthopnea, PND, increased LE swelling, palpitations, dizziness or syncope.   Pt denies polydipsia, polyuria, or new focal neuro s/s.    But also has persistent mild crampy abd pains o/w not explained.  Due for flu shot       Wt Readings from Last 3 Encounters:  09/01/22 130 lb (59 kg)  08/14/22 130 lb (59 kg)  08/07/22 129 lb (58.5 kg)   BP Readings from Last 3 Encounters:  09/01/22 132/78  08/14/22 128/74  08/07/22 (!) 148/84         Past Medical History:  Diagnosis Date   Adenomatous colon polyp    Anxiety    Past Surgical History:  Procedure Laterality Date   APPENDECTOMY  1959   peritonitis   BREAST BIOPSY     benign   COLONOSCOPY  2014   negative; Dr Deatra Ina   TONSILLECTOMY  1959    reports that she has never smoked. She has never used smokeless tobacco. She reports current alcohol use of about 2.0 standard drinks of alcohol per week. She reports that she does not use drugs. family history includes Heart disease in her mother. Allergies  Allergen Reactions   Azithromycin     REACTION: elevated liver enzymes   Penicillins Hives    Hives    Celexa [Citalopram Hydrobromide]    Current Outpatient Medications on File Prior to Visit  Medication Sig Dispense Refill   doxycycline (VIBRA-TABS) 100 MG tablet Take 1 tablet (100 mg total) by mouth 2 (two) times daily. 20 tablet 0   Probiotic Product (ALIGN) 4 MG CAPS Take 1 capsule by mouth daily.     No current facility-administered medications  on file prior to visit.        ROS:  All others reviewed and negative.  Objective        PE:  BP 132/78 (BP Location: Right Arm, Patient Position: Sitting, Cuff Size: Normal)   Pulse 81   Temp 97.9 F (36.6 C) (Oral)   Ht '5\' 8"'$  (1.727 m)   Wt 130 lb (59 kg)   SpO2 98%   BMI 19.77 kg/m                 Constitutional: Pt appears in NAD               HENT: Head: NCAT.                Right Ear: External ear normal.                 Left Ear: External ear normal.                Eyes: . Pupils are equal, round, and reactive to light. Conjunctivae and EOM are normal               Nose: without d/c or deformity  Neck: Neck supple. Gross normal ROM               Cardiovascular: Normal rate and regular rhythm.                 Pulmonary/Chest: Effort normal and breath sounds without rales or wheezing.                Abd:  Soft, NT, ND, + BS, no organomegaly               Neurological: Pt is alert. At baseline orientation, motor grossly intact               Skin: Skin is warm. No rashes, no other new lesions, LE edema - none               Psychiatric: Pt behavior is normal without agitation   Micro: none  Cardiac tracings I have personally interpreted today:  none  Pertinent Radiological findings (summarize): none   Lab Results  Component Value Date   WBC 4.5 10/24/2021   HGB 14.4 10/24/2021   HCT 43.1 10/24/2021   PLT 238.0 10/24/2021   GLUCOSE 98 08/07/2022   CHOL 190 10/24/2021   TRIG 63.0 10/24/2021   HDL 97.60 10/24/2021   LDLCALC 80 10/24/2021   ALT 17 08/07/2022   AST 32 08/07/2022   NA 141 08/07/2022   K 3.6 08/07/2022   CL 103 08/07/2022   CREATININE 0.67 08/07/2022   BUN 11 08/07/2022   CO2 28 08/07/2022   TSH 1.02 10/24/2021   HGBA1C 5.5 10/24/2021   Assessment/Plan:  Kelli Nielsen is a 86 y.o. White or Caucasian [1] female with  has a past medical history of Adenomatous colon polyp and Anxiety.  Abdominal pain Recent labs, CT neg for acute abd  /pelvic issue, suspect IBS type pain - for bentyl 10 mg tid prn,  to f/u any worsening symptoms or concerns  Hyperglycemia Lab Results  Component Value Date   HGBA1C 5.5 10/24/2021   Stable, pt to continue current medical treatment  - diet, wt control, excercise   Vitamin D deficiency Last vitamin D Lab Results  Component Value Date   VD25OH 56.77 10/24/2021   Stable, cont oral replacement  Followup: Return in about 8 weeks (around 10/28/2022).  [name], MD 09/01/2022 8:33 PM [facility]

## 2022-09-01 NOTE — Assessment & Plan Note (Signed)
Recent labs, CT neg for acute abd /pelvic issue, suspect IBS type pain - for bentyl 10 mg tid prn,  to f/u any worsening symptoms or concerns

## 2022-09-01 NOTE — Patient Instructions (Addendum)
You had the flu shot today  Please take all new medication as prescribed - the dicyclomine as needed for bowel pains  Please have the RSV and Shingles vaccines done at your local pharmacy  Please continue all other medications as before, and refills have been done if requested.  Please have the pharmacy call with any other refills you may need  Please keep your appointments with your specialists as you may have planned  Please make an Appointment to return in Oct 28 2022 as planned

## 2022-09-01 NOTE — Assessment & Plan Note (Signed)
Lab Results  Component Value Date   HGBA1C 5.5 10/24/2021   Stable, pt to continue current medical treatment  - diet, wt control, excercise

## 2022-09-02 ENCOUNTER — Telehealth: Payer: Self-pay

## 2022-09-02 NOTE — Telephone Encounter (Signed)
PA started   Key: [facility]

## 2022-10-27 DIAGNOSIS — H524 Presbyopia: Secondary | ICD-10-CM | POA: Diagnosis not present

## 2022-10-28 ENCOUNTER — Ambulatory Visit (INDEPENDENT_AMBULATORY_CARE_PROVIDER_SITE_OTHER): Payer: Medicare Other | Admitting: Internal Medicine

## 2022-10-28 VITALS — BP 128/78 | HR 84 | Temp 98.1°F | Ht 68.0 in | Wt 130.0 lb

## 2022-10-28 DIAGNOSIS — E782 Mixed hyperlipidemia: Secondary | ICD-10-CM

## 2022-10-28 DIAGNOSIS — E538 Deficiency of other specified B group vitamins: Secondary | ICD-10-CM

## 2022-10-28 DIAGNOSIS — E559 Vitamin D deficiency, unspecified: Secondary | ICD-10-CM

## 2022-10-28 DIAGNOSIS — R739 Hyperglycemia, unspecified: Secondary | ICD-10-CM | POA: Diagnosis not present

## 2022-10-28 DIAGNOSIS — Z0001 Encounter for general adult medical examination with abnormal findings: Secondary | ICD-10-CM

## 2022-10-28 DIAGNOSIS — R911 Solitary pulmonary nodule: Secondary | ICD-10-CM

## 2022-10-28 LAB — URINALYSIS, ROUTINE W REFLEX MICROSCOPIC
Bilirubin Urine: NEGATIVE
Hgb urine dipstick: NEGATIVE
Ketones, ur: NEGATIVE
Leukocytes,Ua: NEGATIVE
Nitrite: NEGATIVE
RBC / HPF: NONE SEEN (ref 0–?)
Specific Gravity, Urine: <=1.005 — AB (ref 1.000–1.030)
Total Protein, Urine: NEGATIVE
Urine Glucose: NEGATIVE
Urobilinogen, UA: 0.2 (ref 0.0–1.0)
WBC, UA: NONE SEEN (ref 0–?)
pH: 6 (ref 5.0–8.0)

## 2022-10-28 LAB — HEPATIC FUNCTION PANEL
ALT: 13 U/L (ref 0–35)
AST: 23 U/L (ref 0–37)
Albumin: 4.5 g/dL (ref 3.5–5.2)
Alkaline Phosphatase: 48 U/L (ref 39–117)
Bilirubin, Direct: 0.1 mg/dL (ref 0.0–0.3)
Total Bilirubin: 0.6 mg/dL (ref 0.2–1.2)
Total Protein: 7.4 g/dL (ref 6.0–8.3)

## 2022-10-28 LAB — CBC WITH DIFFERENTIAL/PLATELET
Basophils Absolute: 0 10*3/uL (ref 0.0–0.1)
Basophils Relative: 0.5 % (ref 0.0–3.0)
Eosinophils Absolute: 0 10*3/uL (ref 0.0–0.7)
Eosinophils Relative: 0.7 % (ref 0.0–5.0)
HCT: 40.2 % (ref 36.0–46.0)
Hemoglobin: 13.7 g/dL (ref 12.0–15.0)
Lymphocytes Relative: 24.4 % (ref 12.0–46.0)
Lymphs Abs: 1.3 10*3/uL (ref 0.7–4.0)
MCHC: 34 g/dL (ref 30.0–36.0)
MCV: 100.8 fl — ABNORMAL HIGH (ref 78.0–100.0)
Monocytes Absolute: 0.5 10*3/uL (ref 0.1–1.0)
Monocytes Relative: 8.8 % (ref 3.0–12.0)
Neutro Abs: 3.4 10*3/uL (ref 1.4–7.7)
Neutrophils Relative %: 65.6 % (ref 43.0–77.0)
Platelets: 262 10*3/uL (ref 150.0–400.0)
RBC: 3.99 Mil/uL (ref 3.87–5.11)
RDW: 12.8 % (ref 11.5–15.5)
WBC: 5.2 10*3/uL (ref 4.0–10.5)

## 2022-10-28 LAB — VITAMIN B12: Vitamin B-12: 242 pg/mL (ref 211–911)

## 2022-10-28 LAB — BASIC METABOLIC PANEL
BUN: 20 mg/dL (ref 6–23)
CO2: 27 mEq/L (ref 19–32)
Calcium: 9.3 mg/dL (ref 8.4–10.5)
Chloride: 104 mEq/L (ref 96–112)
Creatinine, Ser: 0.62 mg/dL (ref 0.40–1.20)
GFR: 81.69 mL/min (ref >60.00)
Glucose, Bld: 98 mg/dL (ref 70–99)
Potassium: 4 mEq/L (ref 3.5–5.1)
Sodium: 140 mEq/L (ref 135–145)

## 2022-10-28 LAB — LIPID PANEL
Cholesterol: 192 mg/dL (ref 0–200)
HDL: 92.8 mg/dL (ref >39.00)
LDL Cholesterol: 85 mg/dL (ref 0–99)
NonHDL: 98.87
Total CHOL/HDL Ratio: 2
Triglycerides: 69 mg/dL (ref 0.0–149.0)
VLDL: 13.8 mg/dL (ref 0.0–40.0)

## 2022-10-28 LAB — VITAMIN D 25 HYDROXY (VIT D DEFICIENCY, FRACTURES): VITD: 41.26 ng/mL (ref 30.00–100.00)

## 2022-10-28 LAB — HEMOGLOBIN A1C: Hgb A1c MFr Bld: 5.6 % (ref 4.6–6.5)

## 2022-10-28 LAB — TSH: TSH: 0.91 u[IU]/mL (ref 0.35–5.50)

## 2022-10-28 NOTE — Progress Notes (Signed)
Patient ID: Kelli Nielsen, female   DOB: 20-Mar-1936, 86 y.o.   MRN: [id number]         Chief Complaint:: wellness exam and pulmonary nodules, hyperglycemia, hld, low vit d       HPI:  Kelli Nielsen is a 86 y.o. female here for wellness exam; will have shingrix at pharmacy, declines covid booster, o/w up to date                        Also Pt denies chest pain, increased sob or doe, wheezing, orthopnea, PND, increased LE swelling, palpitations, dizziness or syncope.   Pt denies polydipsia, polyuria, or new focal neuro s/s.    Pt denies fever, wt loss, night sweats, loss of appetite, or other constitutional symptoms  Working full time at a job requiring standing walking most of the day, but denies any significant pain.  Was able to spread 5 bales of pine needles yesterday at her home.   Wt Readings from Last 3 Encounters:  10/28/22 130 lb (59 kg)  09/01/22 130 lb (59 kg)  08/14/22 130 lb (59 kg)   BP Readings from Last 3 Encounters:  10/28/22 128/78  09/01/22 132/78  08/14/22 128/74   Immunization History  Administered Date(s) Administered   Fluad Quad(high Dose 65+) 07/30/2019, 09/12/2020, 08/31/2021, 09/01/2022   Influenza Split 09/02/2011, 09/08/2012   Influenza Whole 10/06/2007, 08/16/2008, 08/21/2009, 09/05/2010   Influenza, High Dose Seasonal PF 08/17/2015, 08/12/2016, 08/18/2017, 08/11/2018   Influenza,inj,Quad PF,6+ Mos 08/15/2014   PFIZER(Purple Top)SARS-COV-2 Vaccination 01/08/2020, 01/31/2020, 11/21/2020   Pneumococcal Conjugate-13 03/27/2016   Pneumococcal Polysaccharide-23 09/04/2008   Tdap 11/21/2011   There are no preventive care reminders to display for this patient.     Past Medical History:  Diagnosis Date   Adenomatous colon polyp    Anxiety    Past Surgical History:  Procedure Laterality Date   APPENDECTOMY  1959   peritonitis   BREAST BIOPSY     benign   COLONOSCOPY  2014   negative; Dr Deatra Ina   TONSILLECTOMY  1959    reports that she has never  smoked. She has never used smokeless tobacco. She reports current alcohol use of about 2.0 standard drinks of alcohol per week. She reports that she does not use drugs. family history includes Heart disease in her mother. Allergies  Allergen Reactions   Azithromycin     REACTION: elevated liver enzymes   Penicillins Hives    Hives    Celexa [Citalopram Hydrobromide]    Current Outpatient Medications on File Prior to Visit  Medication Sig Dispense Refill   dicyclomine (BENTYL) 10 MG capsule Take 1 capsule (10 mg total) by mouth every 6 (six) hours as needed for spasms. (Patient not taking: Reported on 10/28/2022) 60 capsule 2   Probiotic Product (ALIGN) 4 MG CAPS Take 1 capsule by mouth daily.     No current facility-administered medications on file prior to visit.        ROS:  All others reviewed and negative.  Objective        PE:  BP 128/78 (BP Location: Left Arm, Patient Position: Sitting, Cuff Size: Normal)   Pulse 84   Temp 98.1 F (36.7 C) (Oral)   Ht '5\' 8"'$  (1.727 m)   Wt 130 lb (59 kg)   SpO2 97%   BMI 19.77 kg/m                 Constitutional: Pt appears  in NAD               HENT: Head: NCAT.                Right Ear: External ear normal.                 Left Ear: External ear normal.                Eyes: . Pupils are equal, round, and reactive to light. Conjunctivae and EOM are normal               Nose: without d/c or deformity               Neck: Neck supple. Gross normal ROM               Cardiovascular: Normal rate and regular rhythm.                 Pulmonary/Chest: Effort normal and breath sounds without rales or wheezing.                Abd:  Soft, NT, ND, + BS, no organomegaly               Neurological: Pt is alert. At baseline orientation, motor grossly intact               Skin: Skin is warm. No rashes, no other new lesions, LE edema - none               Psychiatric: Pt behavior is normal without agitation   Micro: none  Cardiac tracings I have  personally interpreted today:  none  Pertinent Radiological findings (summarize): none   Lab Results  Component Value Date   WBC 5.2 10/28/2022   HGB 13.7 10/28/2022   HCT 40.2 10/28/2022   PLT 262.0 10/28/2022   GLUCOSE 98 10/28/2022   CHOL 192 10/28/2022   TRIG 69.0 10/28/2022   HDL 92.80 10/28/2022   LDLCALC 85 10/28/2022   ALT 13 10/28/2022   AST 23 10/28/2022   NA 140 10/28/2022   K 4.0 10/28/2022   CL 104 10/28/2022   CREATININE 0.62 10/28/2022   BUN 20 10/28/2022   CO2 27 10/28/2022   TSH 0.91 10/28/2022   HGBA1C 5.6 10/28/2022   Assessment/Plan:  Kelli Nielsen is a 86 y.o. White or Caucasian [1] female with  has a past medical history of Adenomatous colon polyp and Anxiety.  Encounter for well adult exam with abnormal findings Age and sex appropriate education and counseling updated with regular exercise and diet Referrals for preventative services - none needed Immunizations addressed - decliens covid booster, pt for shingrix at the pharmacy Smoking counseling  - none needed Evidence for depression or other mood disorder - none significant Most recent labs reviewed. I have personally reviewed and have noted: 1) the patient's medical and social history 2) The patient's current medications and supplements 3) The patient's height, weight, and BMI have been recorded in the chart   Hyperglycemia Lab Results  Component Value Date   HGBA1C 5.6 10/28/2022   Stable, pt to continue current medical treatment  - diet, wt control, excercise  Mixed hyperlipidemia Lab Results  Component Value Date   LDLCALC 85 10/28/2022   Stable, pt to continue current low chol diet   Solid nodule of lung 6 mm to 8 mm in diameter Uncontrolled, etiology unclear, non smoker, Needs non contrast CT chest in 6-12 months, most  likely at next visit  Vitamin D deficiency Last vitamin D Lab Results  Component Value Date   VD25OH 41.26 10/28/2022   Stable, cont oral  replacement  Followup: Return in about 6 months (around 04/29/2023).  [name], MD 11/01/2022 11:15 AM [facility]

## 2022-10-28 NOTE — Patient Instructions (Addendum)
Please have your Shingrix (shingles) shots done at your local pharmacy.  Please continue all other medications as before, and refills have been done if requested.  Please have the pharmacy call with any other refills you may need.  Please continue your efforts at being more active, low cholesterol diet, and weight control.  You are otherwise up to date with prevention measures today.  Please keep your appointments with your specialists as you may have planned  Please go to the LAB at the blood drawing area for the tests to be done  You will be contacted by phone if any changes need to be made immediately.  Otherwise, you will receive a letter about your results with an explanation, but please check with MyChart first.  Please remember to sign up for MyChart if you have not done so, as this will be important to you in the future with finding out test results, communicating by private email, and scheduling acute appointments online when needed.  Please make an Appointment to return in 6 months, or sooner if needed, as we will need to have a repeat CT scan for the lungs

## 2022-10-29 ENCOUNTER — Encounter: Payer: Self-pay | Admitting: Internal Medicine

## 2022-11-01 ENCOUNTER — Encounter: Payer: Self-pay | Admitting: Internal Medicine

## 2022-11-01 NOTE — Assessment & Plan Note (Signed)
Lab Results  Component Value Date   LDLCALC 85 10/28/2022   Stable, pt to continue current low chol diet

## 2022-11-01 NOTE — Assessment & Plan Note (Signed)
Uncontrolled, etiology unclear, non smoker, Needs non contrast CT chest in 6-12 months, most likely at next visit

## 2022-11-01 NOTE — Assessment & Plan Note (Signed)
Age and sex appropriate education and counseling updated with regular exercise and diet Referrals for preventative services - none needed Immunizations addressed - decliens covid booster, pt for shingrix at the pharmacy Smoking counseling  - none needed Evidence for depression or other mood disorder - none significant Most recent labs reviewed. I have personally reviewed and have noted: 1) the patient's medical and social history 2) The patient's current medications and supplements 3) The patient's height, weight, and BMI have been recorded in the chart

## 2022-11-01 NOTE — Assessment & Plan Note (Signed)
Last vitamin D Lab Results  Component Value Date   VD25OH 41.26 10/28/2022   Stable, cont oral replacement

## 2022-11-01 NOTE — Assessment & Plan Note (Signed)
Lab Results  Component Value Date   HGBA1C 5.6 10/28/2022   Stable, pt to continue current medical treatment  - diet, wt control, excercise

## 2023-01-27 DIAGNOSIS — H10503 Unspecified blepharoconjunctivitis, bilateral: Secondary | ICD-10-CM | POA: Diagnosis not present

## 2023-02-09 DIAGNOSIS — H1013 Acute atopic conjunctivitis, bilateral: Secondary | ICD-10-CM | POA: Diagnosis not present

## 2023-05-19 DIAGNOSIS — K080 Exfoliation of teeth due to systemic causes: Secondary | ICD-10-CM | POA: Diagnosis not present

## 2023-06-04 ENCOUNTER — Ambulatory Visit (INDEPENDENT_AMBULATORY_CARE_PROVIDER_SITE_OTHER): Payer: Medicare Other

## 2023-06-04 VITALS — Ht 68.0 in | Wt 130.0 lb

## 2023-06-04 DIAGNOSIS — Z0000 Encounter for general adult medical examination without abnormal findings: Secondary | ICD-10-CM | POA: Diagnosis not present

## 2023-06-04 NOTE — Progress Notes (Signed)
Subjective:   Kelli Nielsen is a 87 y.o. female who presents for Medicare Annual (Subsequent) preventive examination.  Visit Complete: Virtual  I connected with  Adair Patter on 06/04/23 by a audio enabled telemedicine application and verified that I am speaking with the correct person using two identifiers.  Patient Location: Home  Provider Location: Office/Clinic  I discussed the limitations of evaluation and management by telemedicine. The patient expressed understanding and agreed to proceed.  Review of Systems     Cardiac Risk Factors include: advanced age (>49men, >4 women);dyslipidemia;family history of premature cardiovascular disease     Objective:    Today's Vitals   06/04/23 1517  Weight: 130 lb (59 kg)  Height: 5\' 8"  (1.727 m)  PainSc: 0-No pain   Body mass index is 19.77 kg/m.     06/04/2023    3:18 PM 07/01/2022    4:11 PM 06/27/2021    3:46 PM  Advanced Directives  Does Patient Have a Medical Advance Directive? Yes Yes Yes  Type of Estate agent of [place];Living will Living will;Healthcare Power of Attorney Living will;Healthcare Power of Attorney  Does patient want to make changes to medical advance directive?  No - Patient declined No - Patient declined  Copy of Healthcare Power of Attorney in Chart? No - copy requested No - copy requested No - copy requested  Would patient like information on creating a medical advance directive?  No - Patient declined     Current Medications (verified) Outpatient Encounter Medications as of 06/04/2023  Medication Sig   dicyclomine (BENTYL) 10 MG capsule Take 1 capsule (10 mg total) by mouth every 6 (six) hours as needed for spasms. (Patient not taking: Reported on 10/28/2022)   Probiotic Product (ALIGN) 4 MG CAPS Take 1 capsule by mouth daily.   No facility-administered encounter medications on file as of 06/04/2023.    Allergies (verified) Azithromycin, Penicillins, and Celexa [citalopram  hydrobromide]   History: Past Medical History:  Diagnosis Date   Adenomatous colon polyp    Anxiety    Past Surgical History:  Procedure Laterality Date   APPENDECTOMY  1959   peritonitis   BREAST BIOPSY     benign   COLONOSCOPY  2014   negative; Dr Arlyce Dice   TONSILLECTOMY  1959   Family History  Problem Relation Age of Onset   Heart disease Mother        ? MI   Diabetes Neg Hx    Cancer Neg Hx    Stroke Neg Hx    Social History   Socioeconomic History   Marital status: Married    Spouse name: Not on file   Number of children: 1   Years of education: 13   Highest education level: Not on file  Occupational History   Not on file  Tobacco Use   Smoking status: Never   Smokeless tobacco: Never  Vaping Use   Vaping status: Never Used  Substance and Sexual Activity   Alcohol use: Yes    Alcohol/week: 2.0 standard drinks of alcohol    Types: 2 Glasses of wine per week   Drug use: No   Sexual activity: Not on file  Other Topics Concern   Not on file  Social History Narrative   Fun: Not much fun    Denies abuse and feels safe at home.    Social Determinants of Health   Financial Resource Strain: Low Risk  (06/04/2023)   Overall Physicist, medical Strain (  CARDIA)    Difficulty of Paying Living Expenses: Not hard at all  Food Insecurity: No Food Insecurity (06/04/2023)   Hunger Vital Sign    Worried About Running Out of Food in the Last Year: Never true    Ran Out of Food in the Last Year: Never true  Transportation Needs: No Transportation Needs (06/04/2023)   PRAPARE - Administrator, Civil Service (Medical): No    Lack of Transportation (Non-Medical): No  Physical Activity: Sufficiently Active (06/04/2023)   Exercise Vital Sign    Days of Exercise per Week: 5 days    Minutes of Exercise per Session: 60 min  Stress: No Stress Concern Present (06/04/2023)   [employer] of Occupational Health - Occupational Stress Questionnaire    Feeling of  Stress : Not at all  Social Connections: Moderately Integrated (06/04/2023)   Social Connection and Isolation Panel [NHANES]    Frequency of Communication with Friends and Family: More than three times a week    Frequency of Social Gatherings with Friends and Family: More than three times a week    Attends Religious Services: 1 to 4 times per year    Active Member of [employer] or Organizations: No    Attends Banker Meetings: 1 to 4 times per year    Marital Status: Widowed    Tobacco Counseling Counseling given: Not Answered   Clinical Intake:  Pre-visit preparation completed: Yes  Pain : No/denies pain Pain Score: 0-No pain     BMI - recorded: 19.77 Nutritional Status: BMI of 19-24  Normal Nutritional Risks: None Diabetes: No  How often do you need to have someone help you when you read instructions, pamphlets, or other written materials from your doctor or pharmacy?: 1 - Never What is the last grade level you completed in school?: HSG  Interpreter Needed?: No  Information entered by :: Brand Males Zaydon Kinser, LPN   Activities of Daily Living    06/04/2023    3:21 PM 07/01/2022    4:22 PM  In your present state of health, do you have any difficulty performing the following activities:  Hearing? 0 0  Vision? 0 0  Difficulty concentrating or making decisions? 0 0  Walking or climbing stairs? 0 0  Dressing or bathing? 0 0  Doing errands, shopping? 0 0  Preparing Food and eating ? N N  Using the Toilet? N N  In the past six months, have you accidently leaked urine? N N  Do you have problems with loss of bowel control? N N  Managing your Medications? N N  Managing your Finances? N N  Housekeeping or managing your Housekeeping? N N    Patient Care Team: Corwin Levins, MD as PCP - General (Internal Medicine) Jethro Bolus, MD as Consulting Physician (Ophthalmology)  Indicate any recent Medical Services you may have received from other than [facility] providers in  the past year (date may be approximate).     Assessment:   This is a routine wellness examination for Kelli Nielsen.  Hearing/Vision screen Hearing Screening - Comments:: Denies hearing difficulties.   Vision Screening - Comments:: Wears rx contacts - up to date with routine eye exams with Jethro Bolus, MD.   Dietary issues and exercise activities discussed:     Goals Addressed             This Visit's Progress    My goal is to stay independent and maintain my health.  Depression Screen    06/04/2023    3:20 PM 10/28/2022    8:23 AM 08/07/2022   11:45 AM 07/01/2022    4:22 PM 01/29/2022   11:05 AM 10/24/2021    8:57 AM 10/24/2021    8:17 AM  PHQ 2/9 Scores  PHQ - 2 Score 0 0 0 0 0 1 0  PHQ- 9 Score 0 0 0  0      Fall Risk    06/04/2023    3:20 PM 10/28/2022    8:23 AM 08/07/2022   11:45 AM 07/01/2022    4:21 PM 10/24/2021    8:55 AM  Fall Risk   Falls in the past year? 0 0 0 0 0  Number falls in past yr: 0  0 0 0  Injury with Fall? 0 0 0 0 0  Risk for fall due to : No Fall Risks No Fall Risks No Fall Risks No Fall Risks   Follow up Falls prevention discussed Falls evaluation completed Falls evaluation completed Falls evaluation completed     MEDICARE RISK AT HOME:  Medicare Risk at Home - 06/04/23 1519     Any stairs in or around the home? No    If so, are there any without handrails? No    Home free of loose throw rugs in walkways, pet beds, electrical cords, etc? Yes    Adequate lighting in your home to reduce risk of falls? Yes    Life alert? No    Use of a cane, walker or w/c? No    Grab bars in the bathroom? No    Shower chair or bench in shower? No    Elevated toilet seat or a handicapped toilet? No             TIMED UP AND GO:  Was the test performed?  No    Cognitive Function:        06/04/2023    3:21 PM 07/01/2022    4:23 PM  6CIT Screen  What Year? 0 points 0 points  What month? 0 points 0 points  What time? 0 points 0 points  Count back  from 20 0 points 0 points  Months in reverse 0 points 0 points  Repeat phrase 0 points 0 points  Total Score 0 points 0 points    Immunizations Immunization History  Administered Date(s) Administered   Fluad Quad(high Dose 65+) 07/30/2019, 09/12/2020, 08/31/2021, 09/01/2022   Influenza Split 09/02/2011, 09/08/2012   Influenza Whole 10/06/2007, 08/16/2008, 08/21/2009, 09/05/2010   Influenza, High Dose Seasonal PF 08/17/2015, 08/12/2016, 08/18/2017, 08/11/2018   Influenza,inj,Quad PF,6+ Mos 08/15/2014   PFIZER(Purple Top)SARS-COV-2 Vaccination 01/08/2020, 01/31/2020, 11/21/2020   Pneumococcal Conjugate-13 03/27/2016   Pneumococcal Polysaccharide-23 09/04/2008   Tdap 11/21/2011    TDAP status: Due, Education has been provided regarding the importance of this vaccine. Advised may receive this vaccine at local pharmacy or Health Dept. Aware to provide a copy of the vaccination record if obtained from local pharmacy or Health Dept. Verbalized acceptance and understanding.  Flu Vaccine status: Up to date  Pneumococcal vaccine status: Up to date  Covid-19 vaccine status: Completed vaccines; declined vaccine.  Qualifies for Shingles Vaccine? Yes   Zostavax completed No   Shingrix Completed?: No.    Education has been provided regarding the importance of this vaccine. Patient has been advised to call insurance company to determine out of pocket expense if they have not yet received this vaccine. Advised may also receive vaccine at local  pharmacy or Health Dept. Verbalized acceptance and understanding.  Screening Tests Health Maintenance  Topic Date Due   COVID-19 Vaccine (4 - 2023-24 season) 06/20/2023 (Originally 07/25/2022)   INFLUENZA VACCINE  06/25/2023   Medicare Annual Wellness (AWV)  06/03/2024   Pneumonia Vaccine 22+ Years old  Completed   DEXA SCAN  Completed   HPV VACCINES  Aged Out   DTaP/Tdap/Td  Discontinued   Zoster Vaccines- Shingrix  Discontinued    Health  Maintenance  There are no preventive care reminders to display for this patient.   Colorectal cancer screening: No longer required.   Mammogram status: No longer required due to age.  Bone Density status: No longer required due to age.  Lung Cancer Screening: (Low Dose CT Chest recommended if Age 18-80 years, 20 pack-year currently smoking OR have quit w/in 15years.) does not qualify.   Lung Cancer Screening Referral: no  Additional Screening:  Hepatitis C Screening: does not qualify; Completed no  Vision Screening: Recommended annual ophthalmology exams for early detection of glaucoma and other disorders of the eye. Is the patient up to date with their annual eye exam?  Yes  Who is the provider or what is the name of the office in which the patient attends annual eye exams? Jethro Bolus, MD. If pt is not established with a provider, would they like to be referred to a provider to establish care? No .   Dental Screening: Recommended annual dental exams for proper oral hygiene  Diabetic Foot Exam: N/A  Community Resource Referral / Chronic Care Management: CRR required this visit?  No   CCM required this visit?  No     Plan:     I have personally reviewed and noted the following in the patient's chart:   Medical and social history Use of alcohol, tobacco or illicit drugs  Current medications and supplements including opioid prescriptions. Patient is not currently taking opioid prescriptions. Functional ability and status Nutritional status Physical activity Advanced directives List of other physicians Hospitalizations, surgeries, and ER visits in previous 12 months Vitals Screenings to include cognitive, depression, and falls Referrals and appointments  In addition, I have reviewed and discussed with patient certain preventive protocols, quality metrics, and best practice recommendations. A written personalized care plan for preventive services as well as general  preventive health recommendations were provided to patient.     Mickeal Needy, LPN   03/02/8118   After Visit Summary: (Mail) Due to this being a telephonic visit, the after visit summary with patients personalized plan was offered to patient via mail   Nurse Notes: Normal cognitive status assessed by direct observation via telephone conversation by this Nurse Health Advisor. No abnormalities found.

## 2023-06-04 NOTE — Patient Instructions (Addendum)
Kelli Nielsen , Thank you for taking time to come for your Medicare Wellness Visit. I appreciate your ongoing commitment to your health goals. Please review the following plan we discussed and let me know if I can assist you in the future.   These are the goals we discussed:  Goals      My goal is to stay independent and maintain my health.        This is a list of the screening recommended for you and due dates:  Health Maintenance  Topic Date Due   COVID-19 Vaccine (4 - 2023-24 season) 06/20/2023*   Flu Shot  06/25/2023   Medicare Annual Wellness Visit  06/03/2024   Pneumonia Vaccine  Completed   DEXA scan (bone density measurement)  Completed   HPV Vaccine  Aged Out   DTaP/Tdap/Td vaccine  Discontinued   Zoster (Shingles) Vaccine  Discontinued  *Topic was postponed. The date shown is not the original due date.    Advanced directives: Yes  Conditions/risks identified: Yes  Next appointment: It was nice speaking with you today!  Please follow up in one year for your annual wellness visit via telephone call with Nurse Percell Miller on 06/06/2024 at 3:30 p.m.  If you need to cancel or reschedule please call [phone].   Preventive Care 68 Years and Older, Female Preventive care refers to lifestyle choices and visits with your health care provider that can promote health and wellness. What does preventive care include? A yearly physical exam. This is also called an annual well check. Dental exams once or twice a year. Routine eye exams. Ask your health care provider how often you should have your eyes checked. Personal lifestyle choices, including: Daily care of your teeth and gums. Regular physical activity. Eating a healthy diet. Avoiding tobacco and drug use. Limiting alcohol use. Practicing safe sex. Taking low-dose aspirin every day. Taking vitamin and mineral supplements as recommended by your health care provider. What happens during an annual well check? The services and  screenings done by your health care provider during your annual well check will depend on your age, overall health, lifestyle risk factors, and family history of disease. Counseling  Your health care provider may ask you questions about your: Alcohol use. Tobacco use. Drug use. Emotional well-being. Home and relationship well-being. Sexual activity. Eating habits. History of falls. Memory and ability to understand (cognition). Work and work Astronomer. Reproductive health. Screening  You may have the following tests or measurements: Height, weight, and BMI. Blood pressure. Lipid and cholesterol levels. These may be checked every 5 years, or more frequently if you are over 85 years old. Skin check. Lung cancer screening. You may have this screening every year starting at age 85 if you have a 30-pack-year history of smoking and currently smoke or have quit within the past 15 years. Fecal occult blood test (FOBT) of the stool. You may have this test every year starting at age 85. Flexible sigmoidoscopy or colonoscopy. You may have a sigmoidoscopy every 5 years or a colonoscopy every 10 years starting at age 85. Hepatitis C blood test. Hepatitis B blood test. Sexually transmitted disease (STD) testing. Diabetes screening. This is done by checking your blood sugar (glucose) after you have not eaten for a while (fasting). You may have this done every 1-3 years. Bone density scan. This is done to screen for osteoporosis. You may have this done starting at age 85. Mammogram. This may be done every 1-2 years. Talk to your  health care provider about how often you should have regular mammograms. Talk with your health care provider about your test results, treatment options, and if necessary, the need for more tests. Vaccines  Your health care provider may recommend certain vaccines, such as: Influenza vaccine. This is recommended every year. Tetanus, diphtheria, and acellular pertussis (Tdap,  Td) vaccine. You may need a Td booster every 10 years. Zoster vaccine. You may need this after age 85. Pneumococcal 13-valent conjugate (PCV13) vaccine. One dose is recommended after age 85. Pneumococcal polysaccharide (PPSV23) vaccine. One dose is recommended after age 75. Talk to your health care provider about which screenings and vaccines you need and how often you need them. This information is not intended to replace advice given to you by your health care provider. Make sure you discuss any questions you have with your health care provider. Document Released: 12/07/2015 Document Revised: 07/30/2016 Document Reviewed: 09/11/2015 Elsevier Interactive Patient Education  2017 [employer].  Fall Prevention in the Home Falls can cause injuries. They can happen to people of all ages. There are many things you can do to make your home safe and to help prevent falls. What can I do on the outside of my home? Regularly fix the edges of walkways and driveways and fix any cracks. Remove anything that might make you trip as you walk through a door, such as a raised step or threshold. Trim any bushes or trees on the path to your home. Use bright outdoor lighting. Clear any walking paths of anything that might make someone trip, such as rocks or tools. Regularly check to see if handrails are loose or broken. Make sure that both sides of any steps have handrails. Any raised decks and porches should have guardrails on the edges. Have any leaves, snow, or ice cleared regularly. Use sand or salt on walking paths during winter. Clean up any spills in your garage right away. This includes oil or grease spills. What can I do in the bathroom? Use night lights. Install grab bars by the toilet and in the tub and shower. Do not use towel bars as grab bars. Use non-skid mats or decals in the tub or shower. If you need to sit down in the shower, use a plastic, non-slip stool. Keep the floor dry. Clean up any  water that spills on the floor as soon as it happens. Remove soap buildup in the tub or shower regularly. Attach bath mats securely with double-sided non-slip rug tape. Do not have throw rugs and other things on the floor that can make you trip. What can I do in the bedroom? Use night lights. Make sure that you have a light by your bed that is easy to reach. Do not use any sheets or blankets that are too big for your bed. They should not hang down onto the floor. Have a firm chair that has side arms. You can use this for support while you get dressed. Do not have throw rugs and other things on the floor that can make you trip. What can I do in the kitchen? Clean up any spills right away. Avoid walking on wet floors. Keep items that you use a lot in easy-to-reach places. If you need to reach something above you, use a strong step stool that has a grab bar. Keep electrical cords out of the way. Do not use floor polish or wax that makes floors slippery. If you must use wax, use non-skid floor wax. Do not have  throw rugs and other things on the floor that can make you trip. What can I do with my stairs? Do not leave any items on the stairs. Make sure that there are handrails on both sides of the stairs and use them. Fix handrails that are broken or loose. Make sure that handrails are as long as the stairways. Check any carpeting to make sure that it is firmly attached to the stairs. Fix any carpet that is loose or worn. Avoid having throw rugs at the top or bottom of the stairs. If you do have throw rugs, attach them to the floor with carpet tape. Make sure that you have a light switch at the top of the stairs and the bottom of the stairs. If you do not have them, ask someone to add them for you. What else can I do to help prevent falls? Wear shoes that: Do not have high heels. Have rubber bottoms. Are comfortable and fit you well. Are closed at the toe. Do not wear sandals. If you use a  stepladder: Make sure that it is fully opened. Do not climb a closed stepladder. Make sure that both sides of the stepladder are locked into place. Ask someone to hold it for you, if possible. Clearly mark and make sure that you can see: Any grab bars or handrails. First and last steps. Where the edge of each step is. Use tools that help you move around (mobility aids) if they are needed. These include: Canes. Walkers. Scooters. Crutches. Turn on the lights when you go into a dark area. Replace any light bulbs as soon as they burn out. Set up your furniture so you have a clear path. Avoid moving your furniture around. If any of your floors are uneven, fix them. If there are any pets around you, be aware of where they are. Review your medicines with your doctor. Some medicines can make you feel dizzy. This can increase your chance of falling. Ask your doctor what other things that you can do to help prevent falls. This information is not intended to replace advice given to you by your health care provider. Make sure you discuss any questions you have with your health care provider. Document Released: 09/06/2009 Document Revised: 04/17/2016 Document Reviewed: 12/15/2014 Elsevier Interactive Patient Education  2017 [employer].

## 2023-06-11 IMAGING — DX DG KNEE COMPLETE 4+V*L*
4 series · 4 of 4 positions shown · non-contrast
Comparison: 05/17/2021

CLINICAL DATA: Chronic left knee pain and swelling

EXAM:
LEFT KNEE - COMPLETE 4+ VIEW

[knee ap]
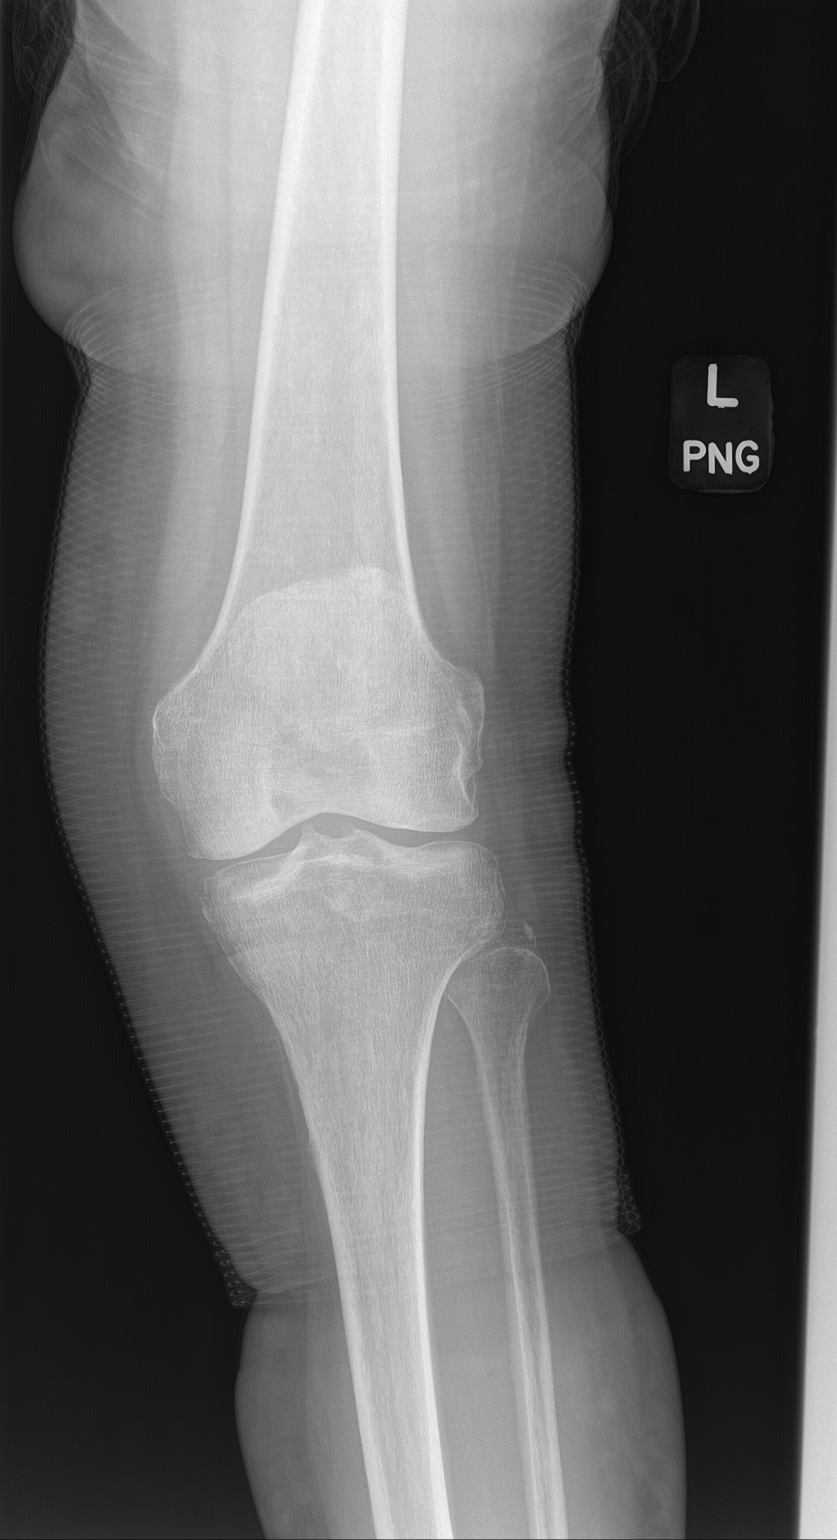

[knee obl (1 of 2)]
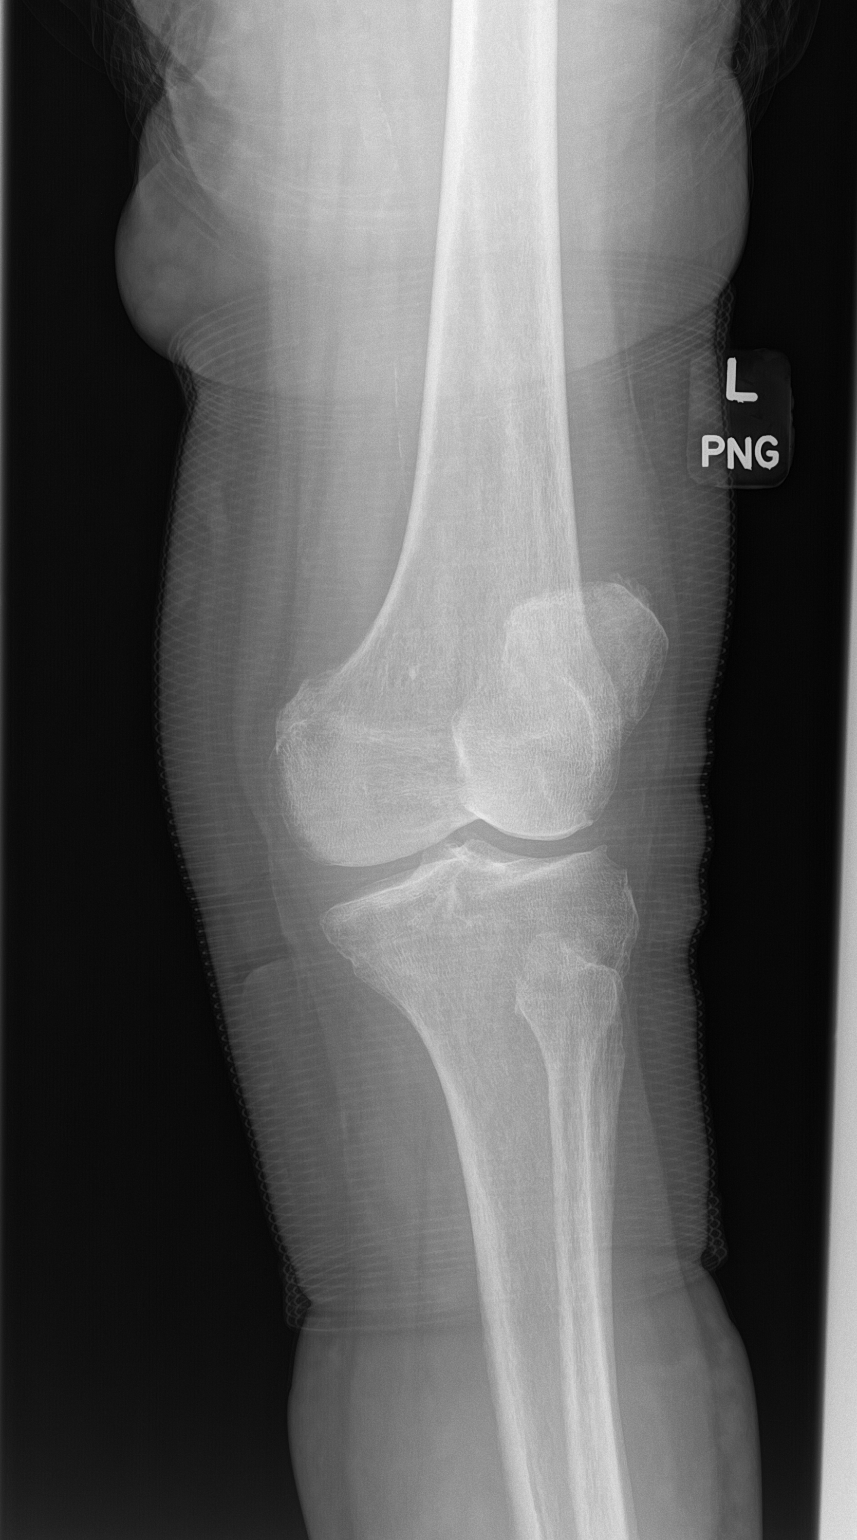

[knee obl (2 of 2)]
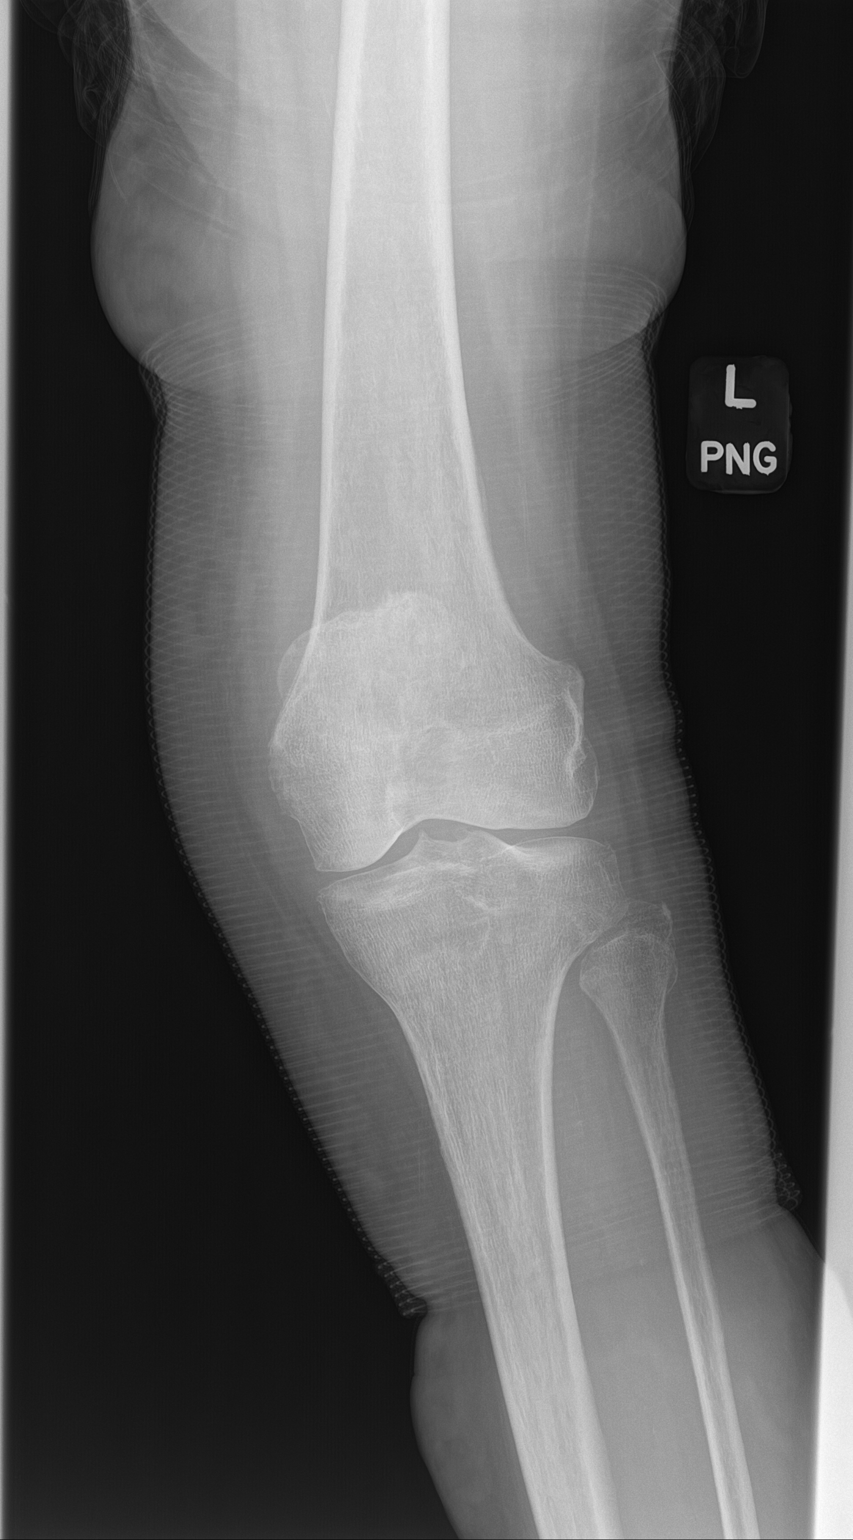

[knee lat]
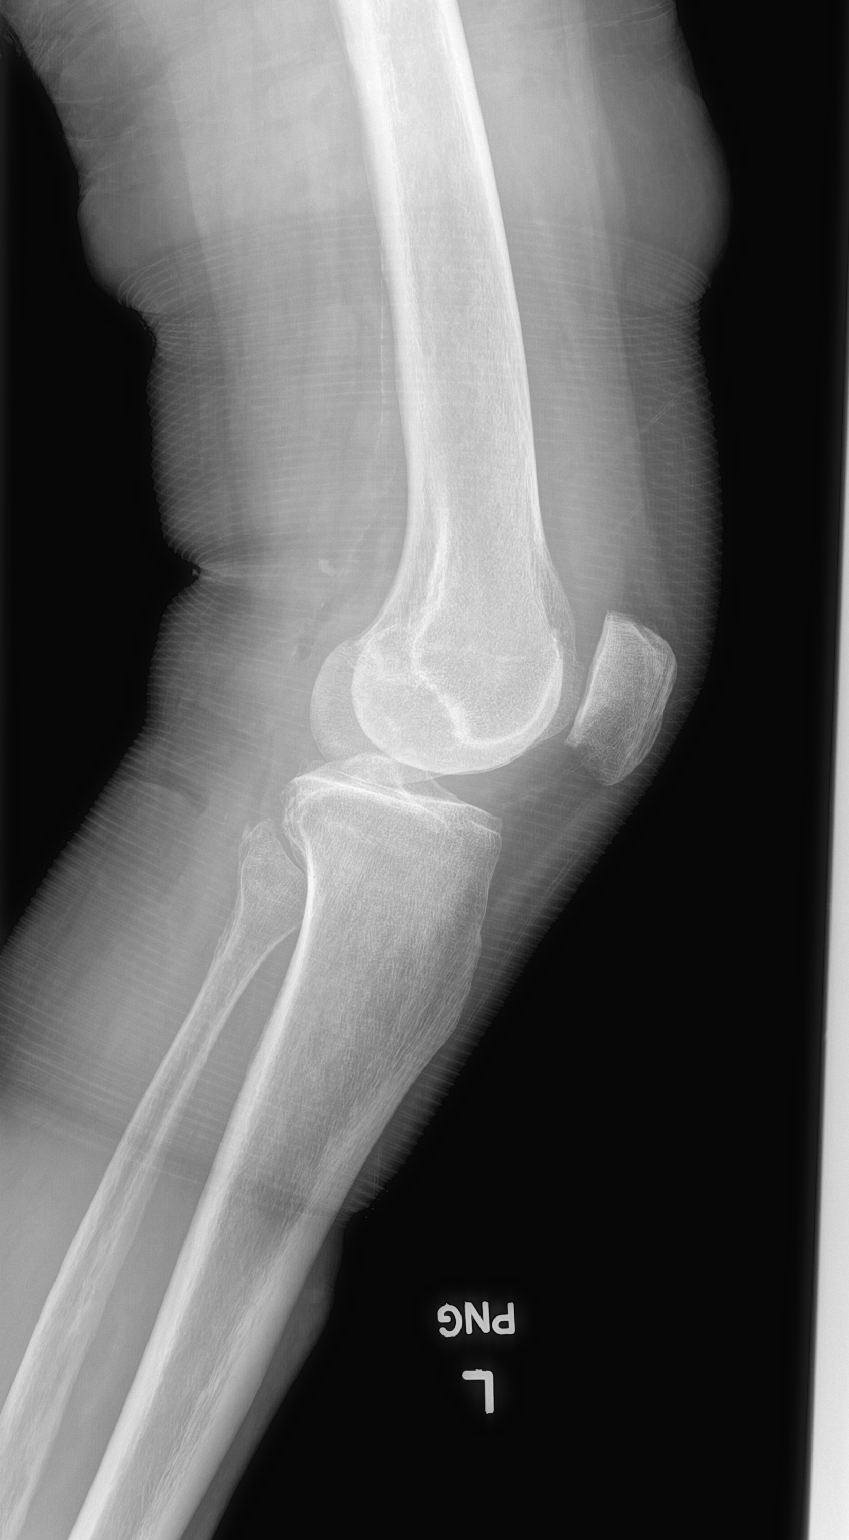

[4 of 4 positions shown; findings below may reference images not displayed]

FINDINGS: Frontal, bilateral oblique, lateral views of the left knee are
obtained. Compression bandage is seen surrounding the left knee. No
fracture, subluxation, or dislocation. Mild 3 compartmental
osteoarthritis greatest laterally. Small joint effusion. Soft
tissues are unremarkable.
IMPRESSION: 1. Mild 3 compartmental osteoarthritis.
2. Small suprapatellar joint effusion.

## 2023-06-15 DIAGNOSIS — H16223 Keratoconjunctivitis sicca, not specified as Sjogren's, bilateral: Secondary | ICD-10-CM | POA: Diagnosis not present

## 2023-06-30 DIAGNOSIS — H524 Presbyopia: Secondary | ICD-10-CM | POA: Diagnosis not present

## 2023-06-30 DIAGNOSIS — H1045 Other chronic allergic conjunctivitis: Secondary | ICD-10-CM | POA: Diagnosis not present

## 2023-07-29 DIAGNOSIS — H524 Presbyopia: Secondary | ICD-10-CM | POA: Diagnosis not present

## 2023-09-17 DIAGNOSIS — K080 Exfoliation of teeth due to systemic causes: Secondary | ICD-10-CM | POA: Diagnosis not present

## 2024-01-25 ENCOUNTER — Ambulatory Visit (INDEPENDENT_AMBULATORY_CARE_PROVIDER_SITE_OTHER): Admitting: Internal Medicine

## 2024-01-25 ENCOUNTER — Encounter: Payer: Self-pay | Admitting: Internal Medicine

## 2024-01-25 VITALS — BP 146/90 | HR 94 | Temp 99.5°F | Ht 68.0 in

## 2024-01-25 DIAGNOSIS — R110 Nausea: Secondary | ICD-10-CM | POA: Diagnosis not present

## 2024-01-25 DIAGNOSIS — R6889 Other general symptoms and signs: Secondary | ICD-10-CM | POA: Diagnosis not present

## 2024-01-25 DIAGNOSIS — J069 Acute upper respiratory infection, unspecified: Secondary | ICD-10-CM

## 2024-01-25 DIAGNOSIS — R490 Dysphonia: Secondary | ICD-10-CM | POA: Diagnosis not present

## 2024-01-25 LAB — POCT INFLUENZA A/B
Influenza A, POC: NEGATIVE
Influenza B, POC: NEGATIVE

## 2024-01-25 LAB — POC COVID19 BINAXNOW: SARS Coronavirus 2 Ag: NEGATIVE

## 2024-01-25 LAB — POCT RESPIRATORY SYNCYTIAL VIRUS: RSV Rapid Ag: NEGATIVE

## 2024-01-25 MED ORDER — AZITHROMYCIN 250 MG PO TABS: ORAL_TABLET | ORAL | 1 refills | Status: AC

## 2024-01-25 MED ORDER — ONDANSETRON HCL 4 MG/2ML IJ SOLN
4.0000 mg | Freq: Every day | INTRAMUSCULAR | Status: AC
  Administered 2024-01-25: 4 mg via INTRAMUSCULAR

## 2024-01-25 MED ORDER — ONDANSETRON 4 MG PO TBDP: 4.0000 mg | ORAL_TABLET | Freq: Three times a day (TID) | ORAL | 1 refills | Status: AC | PRN

## 2024-01-25 NOTE — Patient Instructions (Addendum)
 You had the nausea shot today (zofran)  Please take all new medication as prescribed  - the antibiotic, and nausea pill medicine as needed  Please continue all other medications as before, and refills have been done if requested.  Please have the pharmacy call with any other refills you may need.  Please keep your appointments with your specialists as you may have planned  We can hold on lab and xray today as you requested  You will be contacted regarding the referral for: ENT for the more chronic issue of hoarseness  Please make an Appointment to return in 3 months, or sooner if needed

## 2024-01-25 NOTE — Progress Notes (Unsigned)
 Patient ID: Kelli Nielsen, female   DOB: 1936/02/14, 88 y.o.   MRN: [id number]        Chief Complaint: follow up fever, chills nausea URI symptoms, dizzy and hoarseness       HPI:  Kelli Nielsen is a 88 y.o. female here with above x 2 -3 days, just feels bad.  Pt denies chest pain, increased sob or doe, wheezing, orthopnea, PND, increased LE swelling, palpitations, dizziness or syncope.   Pt denies polydipsia, polyuria, or new focal neuro s/s.  Denies urinary symptoms such as dysuria, frequency, urgency, flank pain, hematuria       Wt Readings from Last 3 Encounters:  06/04/23 130 lb (59 kg)  10/28/22 130 lb (59 kg)  09/01/22 130 lb (59 kg)   BP Readings from Last 3 Encounters:  01/25/24 (!) 146/90  10/28/22 128/78  09/01/22 132/78         Past Medical History:  Diagnosis Date   Adenomatous colon polyp    Anxiety    Past Surgical History:  Procedure Laterality Date   APPENDECTOMY  1959   peritonitis   BREAST BIOPSY     benign   COLONOSCOPY  2014   negative; Dr Arlyce Dice   TONSILLECTOMY  1959    reports that she has never smoked. She has never used smokeless tobacco. She reports current alcohol use of about 2.0 standard drinks of alcohol per week. She reports that she does not use drugs. family history includes Heart disease in her mother. Allergies  Allergen Reactions   Azithromycin     REACTION: elevated liver enzymes   Penicillins Hives    Hives    Celexa [Citalopram Hydrobromide]    Current Outpatient Medications on File Prior to Visit  Medication Sig Dispense Refill   dicyclomine (BENTYL) 10 MG capsule Take 1 capsule (10 mg total) by mouth every 6 (six) hours as needed for spasms. (Patient not taking: Reported on 01/25/2024) 60 capsule 2   Probiotic Product (ALIGN) 4 MG CAPS Take 1 capsule by mouth daily. (Patient not taking: Reported on 01/25/2024)     No current facility-administered medications on file prior to visit.        ROS:  All others reviewed and  negative.  Objective        PE:  BP (!) 146/90 (BP Location: Right Arm, Patient Position: Sitting, Cuff Size: Normal)   Pulse 94   Temp 99.5 F (37.5 C) (Oral)   Ht 5\' 8"  (1.727 m)   SpO2 95%   BMI 19.77 kg/m                 Constitutional: Pt appears mild ill, fatigue, weak, unable to stand well from wheelchair               HENT: Head: NCAT.                Right Ear: External ear normal.                 Left Ear: External ear normal.                Eyes: . Pupils are equal, round, and reactive to light. Conjunctivae and EOM are normal               Nose: without d/c or deformity               Neck: Neck supple. Gross normal ROM  Cardiovascular: Normal rate and regular rhythm.                 Pulmonary/Chest: Effort normal and breath sounds decreased without rales or wheezing.                Abd:  Soft, NT, ND, + BS, no organomegaly               Neurological: Pt is alert. At baseline orientation, motor grossly intact               Skin: Skin is warm. No rashes, no other new lesions, LE edema - none               Psychiatric: Pt behavior is normal without agitation   Micro: none  Cardiac tracings I have personally interpreted today:  none  Pertinent Radiological findings (summarize): none   Lab Results  Component Value Date   WBC 5.2 10/28/2022   HGB 13.7 10/28/2022   HCT 40.2 10/28/2022   PLT 262.0 10/28/2022   GLUCOSE 98 10/28/2022   CHOL 192 10/28/2022   TRIG 69.0 10/28/2022   HDL 92.80 10/28/2022   LDLCALC 85 10/28/2022   ALT 13 10/28/2022   AST 23 10/28/2022   NA 140 10/28/2022   K 4.0 10/28/2022   CL 104 10/28/2022   CREATININE 0.62 10/28/2022   BUN 20 10/28/2022   CO2 27 10/28/2022   TSH 0.91 10/28/2022   HGBA1C 5.6 10/28/2022   POCT- Flu - neg, covid - neg, RSV - neg  Assessment/Plan:  Kelli Nielsen is a 88 y.o. White or Caucasian [1] female with  has a past medical history of Adenomatous colon polyp and Anxiety.  URI (upper respiratory  infection) Mild to mod, for antibx course zpack,  to f/u any worsening symptoms or concerns  Nausea Pt quite uncomfortable, for zofran 4 gm IM today, decliens cxr, labs today  Hoarseness More chronic it seems recently, for ENT referral  Followup: Return in about 3 months (around 04/26/2024).  Oliver Barre, MD 01/26/2024 9:01 PM [facility]

## 2024-01-26 ENCOUNTER — Encounter: Payer: Self-pay | Admitting: Internal Medicine

## 2024-01-26 DIAGNOSIS — J069 Acute upper respiratory infection, unspecified: Secondary | ICD-10-CM | POA: Insufficient documentation

## 2024-01-26 DIAGNOSIS — R110 Nausea: Secondary | ICD-10-CM | POA: Insufficient documentation

## 2024-01-26 DIAGNOSIS — R490 Dysphonia: Secondary | ICD-10-CM | POA: Insufficient documentation

## 2024-01-26 NOTE — Assessment & Plan Note (Signed)
 More chronic it seems recently, for ENT referral

## 2024-01-26 NOTE — Assessment & Plan Note (Addendum)
 Pt quite uncomfortable, for zofran 4 gm IM today, decliens cxr, labs today

## 2024-01-26 NOTE — Assessment & Plan Note (Signed)
Mild to mod, for antibx course zpack,  to f/u any worsening symptoms or concerns 

## 2024-01-29 ENCOUNTER — Telehealth: Payer: Self-pay

## 2024-01-29 NOTE — Telephone Encounter (Signed)
 Called and let Pt know

## 2024-01-29 NOTE — Telephone Encounter (Signed)
 Copied from CRM [id number]. Topic: Clinical - Medical Advice >> Jan 29, 2024  3:25 PM Almira Coaster wrote: Reason for CRM: Patient was seen on Monday and prescribed azithromycin (ZITHROMAX) 250 MG tablet, symptoms have improved and she is feeling better but has a lingering cough and some mucus and would like to know what over the counter medications she can take to assist with those symptoms.

## 2024-01-29 NOTE — Telephone Encounter (Signed)
 Ok for otc delsym for cough, and mucinex (plain) at 600 mg twice per day for congestion, thanks

## 2024-06-06 ENCOUNTER — Ambulatory Visit (INDEPENDENT_AMBULATORY_CARE_PROVIDER_SITE_OTHER): Payer: Medicare Other

## 2024-06-06 VITALS — Ht 68.0 in | Wt 130.0 lb

## 2024-06-06 DIAGNOSIS — Z0000 Encounter for general adult medical examination without abnormal findings: Secondary | ICD-10-CM | POA: Diagnosis not present

## 2024-06-06 NOTE — Progress Notes (Addendum)
 Subjective:   Kelli Nielsen is a 88 y.o. who presents for a Medicare Wellness preventive visit.  As a reminder, Annual Wellness Visits don't include a physical exam, and some assessments may be limited, especially if this visit is performed virtually. We may recommend an in-person follow-up visit with your provider if needed.  Visit Complete: Virtual I connected with  Kelli Nielsen on 06/06/24 by a audio enabled telemedicine application and verified that I am speaking with the correct person using two identifiers.  Patient Location: Home  Provider Location: Office/Clinic  I discussed the limitations of evaluation and management by telemedicine. The patient expressed understanding and agreed to proceed.  Vital Signs: Because this visit was a virtual/telehealth visit, some criteria may be missing or patient reported. Any vitals not documented were not able to be obtained and vitals that have been documented are patient reported.  VideoDeclined- This patient declined Librarian, academic. Therefore the visit was completed with audio only.  Persons Participating in Visit: Patient.  AWV Questionnaire: No: Patient Medicare AWV questionnaire was not completed prior to this visit.  Cardiac Risk Factors include: advanced age (>15men, >40 women);dyslipidemia     Objective:    Today's Vitals   06/06/24 1524  Weight: 130 lb (59 kg)  Height: 5' 8 (1.727 m)   Body mass index is 19.77 kg/m.     06/06/2024    3:24 PM 06/04/2023    3:18 PM 07/01/2022    4:11 PM 06/27/2021    3:46 PM  Advanced Directives  Does Patient Have a Medical Advance Directive? Yes Yes Yes Yes  Type of Estate agent of [place];Living will Healthcare Power of [place];Living will Living will;Healthcare Power of Attorney Living will;Healthcare Power of Attorney  Does patient want to make changes to medical advance directive?   No - Patient declined No - Patient declined   Copy of Healthcare Power of Attorney in Chart? No - copy requested No - copy requested No - copy requested No - copy requested  Would patient like information on creating a medical advance directive?   No - Patient declined     Current Medications (verified) Outpatient Encounter Medications as of 06/06/2024  Medication Sig   ondansetron  (ZOFRAN -ODT) 4 MG disintegrating tablet Take 1 tablet (4 mg total) by mouth every 8 (eight) hours as needed.   dicyclomine  (BENTYL ) 10 MG capsule Take 1 capsule (10 mg total) by mouth every 6 (six) hours as needed for spasms. (Patient not taking: Reported on 06/06/2024)   Probiotic Product (ALIGN) 4 MG CAPS Take 1 capsule by mouth daily. (Patient not taking: Reported on 06/06/2024)   Facility-Administered Encounter Medications as of 06/06/2024  Medication   ondansetron  (ZOFRAN ) injection 4 mg    Allergies (verified) Azithromycin , Penicillins, and Celexa  [citalopram  hydrobromide]   History: Past Medical History:  Diagnosis Date   Adenomatous colon polyp    Anxiety    Past Surgical History:  Procedure Laterality Date   APPENDECTOMY  1959   peritonitis   BREAST BIOPSY     benign   COLONOSCOPY  2014   negative; Dr Debrah   TONSILLECTOMY  1959   Family History  Problem Relation Age of Onset   Heart disease Mother        ? MI   Diabetes Neg Hx    Cancer Neg Hx    Stroke Neg Hx    Social History   Socioeconomic History   Marital status: Widowed  Spouse name: Not on file   Number of children: 1   Years of education: 31   Highest education level: Not on file  Occupational History   Not on file  Tobacco Use   Smoking status: Never   Smokeless tobacco: Never  Vaping Use   Vaping status: Never Used  Substance and Sexual Activity   Alcohol use: Yes    Alcohol/week: 2.0 standard drinks of alcohol    Types: 2 Glasses of wine per week    Comment: occa   Drug use: No   Sexual activity: Not Currently  Other Topics Concern   Not on file   Social History Narrative   Fun: Not much fun    Denies abuse and feels safe at home.    Social Drivers of Corporate investment banker Strain: Low Risk  (06/06/2024)   Overall Financial Resource Strain (CARDIA)    Difficulty of Paying Living Expenses: Not hard at all  Food Insecurity: No Food Insecurity (06/06/2024)   Hunger Vital Sign    Worried About Running Out of Food in the Last Year: Never true    Ran Out of Food in the Last Year: Never true  Transportation Needs: No Transportation Needs (06/06/2024)   PRAPARE - Administrator, Civil Service (Medical): No    Lack of Transportation (Non-Medical): No  Physical Activity: Sufficiently Active (06/06/2024)   Exercise Vital Sign    Days of Exercise per Week: 5 days    Minutes of Exercise per Session: 60 min  Stress: No Stress Concern Present (06/06/2024)   [employer] of Occupational Health - Occupational Stress Questionnaire    Feeling of Stress: Not at all  Social Connections: Moderately Integrated (06/06/2024)   Social Connection and Isolation Panel    Frequency of Communication with Friends and Family: More than three times a week    Frequency of Social Gatherings with Friends and Family: More than three times a week    Attends Religious Services: More than 4 times per year    Active Member of [employer] or Organizations: Yes    Attends Banker Meetings: More than 4 times per year    Marital Status: Widowed    Tobacco Counseling Counseling given: No    Clinical Intake:  Pre-visit preparation completed: Yes  Pain : No/denies pain     BMI - recorded: 19.77 Nutritional Status: BMI of 19-24  Normal Nutritional Risks: None Diabetes: No  Lab Results  Component Value Date   HGBA1C 5.6 10/28/2022   HGBA1C 5.5 10/24/2021   HGBA1C 5.6 09/14/2020     How often do you need to have someone help you when you read instructions, pamphlets, or other written materials from your doctor or pharmacy?: 1 -  Never  Interpreter Needed?: No  Information entered by :: Kelli Nielsen, CMA   Activities of Daily Living     06/06/2024    3:27 PM  In your present state of health, do you have any difficulty performing the following activities:  Hearing? 0  Vision? 0  Difficulty concentrating or making decisions? 0  Walking or climbing stairs? 0  Dressing or bathing? 0  Doing errands, shopping? 0  Preparing Food and eating ? N  Using the Toilet? N  In the past six months, have you accidently leaked urine? N  Do you have problems with loss of bowel control? N  Managing your Medications? N  Managing your Finances? N  Housekeeping or managing your Housekeeping?  N    Patient Care Team: Norleen Lynwood ORN, MD as PCP - General (Internal Medicine) Roz Anes, MD as Consulting Physician (Ophthalmology)  I have updated your Care Teams any recent Medical Services you may have received from other providers in the past year.     Assessment:   This is a routine wellness examination for Kelli Nielsen.  Hearing/Vision screen Hearing Screening - Comments:: Denies hearing difficulties   Vision Screening - Comments:: Wears rx glasses - up to date with routine eye exams with [facility]   Goals Addressed               This Visit's Progress     Patient Stated (pt-stated)        Patient stated she plans to continue staying active - works 5 days a week       Depression Screen     06/06/2024    3:28 PM 01/25/2024    3:47 PM 06/04/2023    3:20 PM 10/28/2022    8:23 AM 08/07/2022   11:45 AM 07/01/2022    4:22 PM 01/29/2022   11:05 AM  PHQ 2/9 Scores  PHQ - 2 Score 0 0 0 0 0 0 0  PHQ- 9 Score 0  0 0 0  0    Fall Risk     06/06/2024    3:28 PM 01/25/2024    3:55 PM 06/04/2023    3:20 PM 10/28/2022    8:23 AM 08/07/2022   11:45 AM  Fall Risk   Falls in the past year? 0 0 0 0 0  Number falls in past yr: 0 0 0  0  Injury with Fall? 0 0 0 0 0  Risk for fall due to : No Fall Risks No Fall Risks No  Fall Risks No Fall Risks No Fall Risks  Follow up Falls evaluation completed;Falls prevention discussed Falls evaluation completed Falls prevention discussed Falls evaluation completed  Falls evaluation completed      Data saved with a previous flowsheet row definition    MEDICARE RISK AT HOME:  Medicare Risk at Home Any stairs in or around the home?: No If so, are there any without handrails?: No Home free of loose throw rugs in walkways, pet beds, electrical cords, etc?: Yes Adequate lighting in your home to reduce risk of falls?: Yes Life alert?: No Use of a cane, walker or w/c?: No Grab bars in the bathroom?: No Shower chair or bench in shower?: No Elevated toilet seat or a handicapped toilet?: No  TIMED UP AND GO:  Was the test performed?  No  Cognitive Function: 6CIT completed        06/06/2024    3:30 PM 06/04/2023    3:21 PM 07/01/2022    4:23 PM  6CIT Screen  What Year? 0 points 0 points 0 points  What month? 0 points 0 points 0 points  What time? 0 points 0 points 0 points  Count back from 20 0 points 0 points 0 points  Months in reverse 0 points 0 points 0 points  Repeat phrase 0 points 0 points 0 points  Total Score 0 points 0 points 0 points    Immunizations Immunization History  Administered Date(s) Administered   Fluad Quad(high Dose 65+) 07/30/2019, 09/12/2020, 08/31/2021, 09/01/2022, 09/19/2023   Influenza Split 09/02/2011, 09/08/2012   Influenza Whole 10/06/2007, 08/16/2008, 08/21/2009, 09/05/2010   Influenza, High Dose Seasonal PF 08/17/2015, 08/12/2016, 08/18/2017, 08/11/2018   Influenza,inj,Quad PF,6+ Mos 08/15/2014  PFIZER(Purple Top)SARS-COV-2 Vaccination 01/08/2020, 01/31/2020, 11/21/2020   Pneumococcal Conjugate-13 03/27/2016   Pneumococcal Polysaccharide-23 09/04/2008   Tdap 11/21/2011    Screening Tests Health Maintenance  Topic Date Due   COVID-19 Vaccine (4 - 2024-25 season) 07/26/2023   INFLUENZA VACCINE  06/24/2024   Medicare  Annual Wellness (AWV)  06/06/2025   Pneumococcal Vaccine: 50+ Years  Completed   DEXA SCAN  Completed   Hepatitis B Vaccines  Aged Out   HPV VACCINES  Aged Out   Meningococcal B Vaccine  Aged Out   DTaP/Tdap/Td  Discontinued   Zoster Vaccines- Shingrix  Discontinued    Health Maintenance  Health Maintenance Due  Topic Date Due   COVID-19 Vaccine (4 - 2024-25 season) 07/26/2023   Health Maintenance Items Addressed:06/06/2024   Additional Screening:  Vision Screening: Recommended annual ophthalmology exams for early detection of glaucoma and other disorders of the eye. Would you like a referral to an eye doctor? No    Dental Screening: Recommended annual dental exams for proper oral hygiene  Community Resource Referral / Chronic Care Management: CRR required this visit?  No   CCM required this visit?  No   Plan:    I have personally reviewed and noted the following in the patient's chart:   Medical and social history Use of alcohol, tobacco or illicit drugs  Current medications and supplements including opioid prescriptions. Patient is not currently taking opioid prescriptions. Functional ability and status Nutritional status Physical activity Advanced directives List of other physicians Hospitalizations, surgeries, and ER visits in previous 12 months Vitals Screenings to include cognitive, depression, and falls Referrals and appointments  In addition, I have reviewed and discussed with patient certain preventive protocols, quality metrics, and best practice recommendations. A written personalized care plan for preventive services as well as general preventive health recommendations were provided to patient.   Kelli Kelli Nielsen, CMA   06/06/2024   After Visit Summary: (Declined) Due to this being a telephonic visit, with patients personalized plan was offered to patient but patient Declined AVS at this time   Notes: Nothing significant to report at this time.

## 2024-06-06 NOTE — Patient Instructions (Addendum)
 Kelli Nielsen , Thank you for taking time out of your busy schedule to complete your Annual Wellness Visit with me. I enjoyed our conversation and look forward to speaking with you again next year. I, as well as your care team,  appreciate your ongoing commitment to your health goals. Please review the following plan we discussed and let me know if I can assist you in the future. Your Game plan/ To Do List   Follow up Visits: Next Medicare AWV with our clinical staff: 06/07/2025   Have you seen your provider in the last 6 months (3 months if uncontrolled diabetes)? No Next Office Visit with your provider:   Clinician Recommendations:  Aim for 30 minutes of exercise or brisk walking, 6-8 glasses of water, and 5 servings of fruits and vegetables each day.       This is a list of the screening recommended for you and due dates:  Health Maintenance  Topic Date Due   COVID-19 Vaccine (4 - 2024-25 season) 07/26/2023   Flu Shot  06/24/2024   Medicare Annual Wellness Visit  06/06/2025   Pneumococcal Vaccine for age over 22  Completed   DEXA scan (bone density measurement)  Completed   Hepatitis B Vaccine  Aged Out   HPV Vaccine  Aged Out   Meningitis B Vaccine  Aged Out   DTaP/Tdap/Td vaccine  Discontinued   Zoster (Shingles) Vaccine  Discontinued    Advanced directives: (Copy Requested) Please bring a copy of your health care power of attorney and living will to the office to be added to your chart at your convenience. You can mail to [facility] [address] [place], [place] [address] or email to [email or web address] Advance Care Planning is important because it:  [x]  Makes sure you receive the medical care that is consistent with your values, goals, and preferences  [x]  It provides guidance to your family and loved ones and reduces their decisional burden about whether or not they are making the right decisions based on your wishes.  Follow the link provided in your  after visit summary or read over the paperwork we have mailed to you to help you started getting your Advance Directives in place. If you need assistance in completing these, please reach out to us  so that we can help you!

## 2024-06-09 ENCOUNTER — Encounter: Admitting: Internal Medicine

## 2024-06-10 ENCOUNTER — Encounter: Admitting: Internal Medicine

## 2024-06-24 ENCOUNTER — Encounter: Admitting: Internal Medicine

## 2024-08-09 DIAGNOSIS — H5203 Hypermetropia, bilateral: Secondary | ICD-10-CM | POA: Diagnosis not present

## 2024-08-09 DIAGNOSIS — H25813 Combined forms of age-related cataract, bilateral: Secondary | ICD-10-CM | POA: Diagnosis not present

## 2024-08-26 ENCOUNTER — Ambulatory Visit (INDEPENDENT_AMBULATORY_CARE_PROVIDER_SITE_OTHER)

## 2024-08-26 DIAGNOSIS — Z23 Encounter for immunization: Secondary | ICD-10-CM | POA: Diagnosis not present

## 2024-08-26 NOTE — Progress Notes (Signed)
 Patient visits today for their high dose flu vaccine. Patient informed of what they had received and tolerated the injection well. Patient notified to reach out to the office if needed.

## 2024-10-28 ENCOUNTER — Encounter: Admitting: Internal Medicine

## 2024-11-21 ENCOUNTER — Ambulatory Visit: Admitting: Internal Medicine

## 2024-11-21 ENCOUNTER — Encounter: Payer: Self-pay | Admitting: Internal Medicine

## 2024-11-21 ENCOUNTER — Ambulatory Visit: Payer: Self-pay | Admitting: Internal Medicine

## 2024-11-21 VITALS — BP 126/78 | HR 88 | Temp 97.8°F | Ht 68.0 in | Wt 133.0 lb

## 2024-11-21 DIAGNOSIS — E559 Vitamin D deficiency, unspecified: Secondary | ICD-10-CM

## 2024-11-21 DIAGNOSIS — Z0000 Encounter for general adult medical examination without abnormal findings: Secondary | ICD-10-CM | POA: Diagnosis not present

## 2024-11-21 DIAGNOSIS — R739 Hyperglycemia, unspecified: Secondary | ICD-10-CM | POA: Diagnosis not present

## 2024-11-21 DIAGNOSIS — E782 Mixed hyperlipidemia: Secondary | ICD-10-CM | POA: Diagnosis not present

## 2024-11-21 DIAGNOSIS — R42 Dizziness and giddiness: Secondary | ICD-10-CM | POA: Diagnosis not present

## 2024-11-21 DIAGNOSIS — G4762 Sleep related leg cramps: Secondary | ICD-10-CM | POA: Diagnosis not present

## 2024-11-21 DIAGNOSIS — Z0001 Encounter for general adult medical examination with abnormal findings: Secondary | ICD-10-CM

## 2024-11-21 DIAGNOSIS — E538 Deficiency of other specified B group vitamins: Secondary | ICD-10-CM | POA: Diagnosis not present

## 2024-11-21 LAB — LIPID PANEL
Cholesterol: 193 mg/dL (ref 28–200)
HDL: 85.5 mg/dL
LDL Cholesterol: 96 mg/dL (ref 10–99)
NonHDL: 107.26
Total CHOL/HDL Ratio: 2
Triglycerides: 56 mg/dL (ref 10.0–149.0)
VLDL: 11.2 mg/dL (ref 0.0–40.0)

## 2024-11-21 LAB — BASIC METABOLIC PANEL WITH GFR
BUN: 17 mg/dL (ref 6–23)
CO2: 29 meq/L (ref 19–32)
Calcium: 9.5 mg/dL (ref 8.4–10.5)
Chloride: 103 meq/L (ref 96–112)
Creatinine, Ser: 0.58 mg/dL (ref 0.40–1.20)
GFR: 81.82 mL/min
Glucose, Bld: 98 mg/dL (ref 70–99)
Potassium: 4 meq/L (ref 3.5–5.1)
Sodium: 141 meq/L (ref 135–145)

## 2024-11-21 LAB — HEPATIC FUNCTION PANEL
ALT: 13 U/L (ref 3–35)
AST: 23 U/L (ref 5–37)
Albumin: 4.5 g/dL (ref 3.5–5.2)
Alkaline Phosphatase: 45 U/L (ref 39–117)
Bilirubin, Direct: 0.1 mg/dL (ref 0.1–0.3)
Total Bilirubin: 0.8 mg/dL (ref 0.2–1.2)
Total Protein: 7.3 g/dL (ref 6.0–8.3)

## 2024-11-21 LAB — CBC WITH DIFFERENTIAL/PLATELET
Basophils Absolute: 0 K/uL (ref 0.0–0.1)
Basophils Relative: 0.7 % (ref 0.0–3.0)
Eosinophils Absolute: 0.1 K/uL (ref 0.0–0.7)
Eosinophils Relative: 1.1 % (ref 0.0–5.0)
HCT: 41.4 % (ref 36.0–46.0)
Hemoglobin: 14 g/dL (ref 12.0–15.0)
Lymphocytes Relative: 35.1 % (ref 12.0–46.0)
Lymphs Abs: 1.6 K/uL (ref 0.7–4.0)
MCHC: 33.8 g/dL (ref 30.0–36.0)
MCV: 100.8 fl — ABNORMAL HIGH (ref 78.0–100.0)
Monocytes Absolute: 0.4 K/uL (ref 0.1–1.0)
Monocytes Relative: 9.7 % (ref 3.0–12.0)
Neutro Abs: 2.4 K/uL (ref 1.4–7.7)
Neutrophils Relative %: 53.4 % (ref 43.0–77.0)
Platelets: 242 K/uL (ref 150.0–400.0)
RBC: 4.11 Mil/uL (ref 3.87–5.11)
RDW: 12.5 % (ref 11.5–15.5)
WBC: 4.5 K/uL (ref 4.0–10.5)

## 2024-11-21 LAB — VITAMIN B12: Vitamin B-12: 284 pg/mL (ref 211–911)

## 2024-11-21 LAB — TSH: TSH: 1.94 u[IU]/mL (ref 0.35–5.50)

## 2024-11-21 LAB — VITAMIN D 25 HYDROXY (VIT D DEFICIENCY, FRACTURES): VITD: 27.3 ng/mL — ABNORMAL LOW (ref 30.00–100.00)

## 2024-11-21 LAB — HEMOGLOBIN A1C: Hgb A1c MFr Bld: 5.4 % (ref 4.6–6.5)

## 2024-11-21 NOTE — Assessment & Plan Note (Addendum)
 Pt to continue otc quinine qhs

## 2024-11-21 NOTE — Assessment & Plan Note (Signed)
 Last vitamin D  Lab Results  Component Value Date   VD25OH 27.30 (L) 11/21/2024   Low, to start oral replacement

## 2024-11-21 NOTE — Assessment & Plan Note (Signed)

## 2024-11-21 NOTE — Assessment & Plan Note (Signed)
 Lab Results  Component Value Date   HGBA1C 5.4 11/21/2024   Stable, pt to continue current medical treatment  - diet, wt control

## 2024-11-21 NOTE — Progress Notes (Signed)
 Patient ID: CLARISSE RODRIGES, female   DOB: 10-19-1936, 88 y.o.   MRN: [id number]         Chief Complaint:: wellness exam and nocturnal leg cramps, vertigo, low vit d, hld, hyperglycemia       HPI:  MCKENZEY PARCELL is a 88 y.o. female here for wellness exam, up to date                Also Pt denies chest pain, increased sob or doe, wheezing, orthopnea, PND, increased LE swelling, palpitations, dizziness or syncope.   Pt denies polydipsia, polyuria, or new focal neuro s/s.    Pt denies fever, wt loss, night sweats, loss of appetite, or other constitutional symptoms  Does have mild but recurring bilateral leg cramps at night, better with otc low dose quinine.  Still working 40 hrs per wk.  Also has intermittent vertigo   Wt Readings from Last 3 Encounters:  11/21/24 133 lb (60.3 kg)  06/06/24 130 lb (59 kg)  06/04/23 130 lb (59 kg)   BP Readings from Last 3 Encounters:  11/21/24 126/78  01/25/24 (!) 146/90  10/28/22 128/78   Immunization History  Administered Date(s) Administered   Fluad Quad(high Dose 65+) 07/30/2019, 09/12/2020, 08/31/2021, 09/01/2022, 09/19/2023   INFLUENZA, HIGH DOSE SEASONAL PF 08/17/2015, 08/12/2016, 08/18/2017, 08/11/2018, 08/26/2024   Influenza Split 09/02/2011, 09/08/2012   Influenza Whole 10/06/2007, 08/16/2008, 08/21/2009, 09/05/2010   Influenza,inj,Quad PF,6+ Mos 08/15/2014   PFIZER(Purple Top)SARS-COV-2 Vaccination 01/08/2020, 01/31/2020, 11/21/2020   Pneumococcal Conjugate-13 03/27/2016   Pneumococcal Polysaccharide-23 09/04/2008   Tdap 11/21/2011   Health Maintenance Due  Topic Date Due   COVID-19 Vaccine (4 - 2025-26 season) 07/25/2024      Past Medical History:  Diagnosis Date   Adenomatous colon polyp    Anxiety    Past Surgical History:  Procedure Laterality Date   APPENDECTOMY  1959   peritonitis   BREAST BIOPSY     benign   COLONOSCOPY  2014   negative; Dr Debrah   TONSILLECTOMY  1959    reports that she has never smoked. She has never  used smokeless tobacco. She reports current alcohol use of about 2.0 standard drinks of alcohol per week. She reports that she does not use drugs. family history includes Heart disease in her mother. Allergies[1] Medications Ordered Prior to Encounter[2]      ROS:  All others reviewed and negative.  Objective        PE:  BP 126/78 (BP Location: Right Arm, Patient Position: Sitting, Cuff Size: Normal)   Pulse 88   Temp 97.8 F (36.6 C) (Oral)   Ht 5' 8 (1.727 m)   Wt 133 lb (60.3 kg)   SpO2 91%   BMI 20.22 kg/m                 Constitutional: Pt appears in NAD               HENT: Head: NCAT.                Right Ear: External ear normal.                 Left Ear: External ear normal.                Eyes: . Pupils are equal, round, and reactive to light. Conjunctivae and EOM are normal               Nose: without d/c or deformity  Neck: Neck supple. Gross normal ROM               Cardiovascular: Normal rate and regular rhythm.                 Pulmonary/Chest: Effort normal and breath sounds without rales or wheezing.                Abd:  Soft, NT, ND, + BS, no organomegaly               Neurological: Pt is alert. At baseline orientation, motor grossly intact               Skin: Skin is warm. No rashes, no other new lesions, LE edema - none               Psychiatric: Pt behavior is normal without agitation   Micro: none  Cardiac tracings I have personally interpreted today:  none  Pertinent Radiological findings (summarize): none   Lab Results  Component Value Date   WBC 4.5 11/21/2024   HGB 14.0 11/21/2024   HCT 41.4 11/21/2024   PLT 242.0 11/21/2024   GLUCOSE 98 11/21/2024   CHOL 193 11/21/2024   TRIG 56.0 11/21/2024   HDL 85.50 11/21/2024   LDLCALC 96 11/21/2024   ALT 13 11/21/2024   AST 23 11/21/2024   NA 141 11/21/2024   K 4.0 11/21/2024   CL 103 11/21/2024   CREATININE 0.58 11/21/2024   BUN 17 11/21/2024   CO2 29 11/21/2024   TSH 1.94  11/21/2024   HGBA1C 5.4 11/21/2024   Assessment/Plan:  CAMBRYN CHARTERS is a 88 y.o. White or Caucasian [1] female with  has a past medical history of Adenomatous colon polyp and Anxiety.  Encounter for well adult exam with abnormal findings Age and sex appropriate education and counseling updated with regular exercise and diet Referrals for preventative services - none needed Immunizations addressed - none needed Smoking counseling  - none needed Evidence for depression or other mood disorder - none significant Most recent labs reviewed. I have personally reviewed and have noted: 1) the patient's medical and social history 2) The patient's current medications and supplements 3) The patient's height, weight, and BMI have been recorded in the chart   Vitamin D  deficiency Last vitamin D  Lab Results  Component Value Date   VD25OH 27.30 (L) 11/21/2024   Low, to start oral replacement   Mixed hyperlipidemia Lab Results  Component Value Date   LDLCALC 96 11/21/2024   Stable, pt to continue current statin - diet, wt control  Hyperglycemia Lab Results  Component Value Date   HGBA1C 5.4 11/21/2024   Stable, pt to continue current medical treatment  - diet, wt control   Nocturnal leg cramps Pt to continue otc quinine qhs  Vertigo Pt averse to prescription meds, ok for otc dramamine prn  Followup: Return in about 1 year (around 11/21/2025).  Lynwood Rush, MD 11/21/2024 8:55 PM [facility]     [1]  Allergies Allergen Reactions   Azithromycin      REACTION: elevated liver enzymes   Penicillins Hives    Hives    Celexa  [Citalopram  Hydrobromide]   [2]  Current Outpatient Medications on File Prior to Visit  Medication Sig Dispense Refill   dicyclomine  (BENTYL ) 10 MG capsule Take 1 capsule (10 mg total) by mouth every 6 (six) hours as needed for spasms. (Patient not taking: Reported  on 06/06/2024) 60  capsule 2   ondansetron  (ZOFRAN -ODT) 4 MG disintegrating tablet Take 1 tablet (4 mg total) by mouth every 8 (eight) hours as needed. 30 tablet 1   Probiotic Product (ALIGN) 4 MG CAPS Take 1 capsule by mouth daily. (Patient not taking: Reported on 11/21/2024)     Current Facility-Administered Medications on File Prior to Visit  Medication Dose Route Frequency Provider Last Rate Last Admin   ondansetron  (ZOFRAN ) injection 4 mg  4 mg Intramuscular Daily Norleen Lynwood ORN, MD   4 mg at 01/25/24 1655

## 2024-11-21 NOTE — Patient Instructions (Signed)
 Ok to continue the otc Quinine for the leg cramps  Please continue all other medications as before, and refills have been done if requested.  Please have the pharmacy call with any other refills you may need.  Please continue your efforts at being more active, low cholesterol diet, and weight control.  You are otherwise up to date with prevention measures today.  Please keep your appointments with your specialists as you may have planned  Please go to the LAB at the blood drawing area for the tests to be done  You will be contacted by phone if any changes need to be made immediately.  Otherwise, you will receive a letter about your results with an explanation, but please check with MyChart first.  Please make an Appointment to return for your 1 year visit, or sooner if needed

## 2024-11-21 NOTE — Assessment & Plan Note (Signed)
 Pt averse to prescription meds, ok for otc dramamine prn

## 2024-11-21 NOTE — Assessment & Plan Note (Signed)
 Lab Results  Component Value Date   LDLCALC 96 11/21/2024   Stable, pt to continue current statin - diet, wt control

## 2024-11-22 LAB — URINALYSIS, ROUTINE W REFLEX MICROSCOPIC
Bilirubin Urine: NEGATIVE
Hgb urine dipstick: NEGATIVE
Ketones, ur: NEGATIVE
Leukocytes,Ua: NEGATIVE
Nitrite: NEGATIVE
Specific Gravity, Urine: 1.01 (ref 1.000–1.030)
Total Protein, Urine: NEGATIVE
Urine Glucose: NEGATIVE
Urobilinogen, UA: 0.2 (ref 0.0–1.0)
pH: 6 (ref 5.0–8.0)

## 2025-06-07 ENCOUNTER — Ambulatory Visit
